# Patient Record
Sex: Male | Born: 1955
Health system: Southern US, Community
[De-identification: ages and names within clinical notes are randomized; demographics above are authoritative.]

## PROBLEM LIST (undated history)

## (undated) DIAGNOSIS — I10 Essential (primary) hypertension: Secondary | ICD-10-CM

## (undated) DIAGNOSIS — I2510 Atherosclerotic heart disease of native coronary artery without angina pectoris: Secondary | ICD-10-CM

## (undated) DIAGNOSIS — C4490 Unspecified malignant neoplasm of skin, unspecified: Secondary | ICD-10-CM

## (undated) HISTORY — PX: SKIN CANCER EXCISION: SHX779

## (undated) HISTORY — DX: Atherosclerotic heart disease of native coronary artery without angina pectoris: I25.10

---

## 2016-03-13 DIAGNOSIS — Z131 Encounter for screening for diabetes mellitus: Secondary | ICD-10-CM | POA: Diagnosis not present

## 2016-03-13 DIAGNOSIS — E782 Mixed hyperlipidemia: Secondary | ICD-10-CM | POA: Diagnosis not present

## 2016-03-13 DIAGNOSIS — I10 Essential (primary) hypertension: Secondary | ICD-10-CM | POA: Diagnosis not present

## 2016-04-01 DIAGNOSIS — Z125 Encounter for screening for malignant neoplasm of prostate: Secondary | ICD-10-CM | POA: Diagnosis not present

## 2016-04-01 DIAGNOSIS — Z0000 Encounter for general adult medical examination without abnormal findings: Secondary | ICD-10-CM | POA: Diagnosis not present

## 2016-04-01 DIAGNOSIS — L570 Actinic keratosis: Secondary | ICD-10-CM | POA: Diagnosis not present

## 2016-04-01 DIAGNOSIS — Z23 Encounter for immunization: Secondary | ICD-10-CM | POA: Diagnosis not present

## 2016-04-12 DIAGNOSIS — Z1212 Encounter for screening for malignant neoplasm of rectum: Secondary | ICD-10-CM | POA: Diagnosis not present

## 2016-04-12 DIAGNOSIS — Z1211 Encounter for screening for malignant neoplasm of colon: Secondary | ICD-10-CM | POA: Diagnosis not present

## 2016-05-08 DIAGNOSIS — L570 Actinic keratosis: Secondary | ICD-10-CM | POA: Diagnosis not present

## 2016-05-08 DIAGNOSIS — D485 Neoplasm of uncertain behavior of skin: Secondary | ICD-10-CM | POA: Diagnosis not present

## 2016-05-21 DIAGNOSIS — Z1211 Encounter for screening for malignant neoplasm of colon: Secondary | ICD-10-CM | POA: Diagnosis not present

## 2016-06-04 DIAGNOSIS — H1045 Other chronic allergic conjunctivitis: Secondary | ICD-10-CM | POA: Diagnosis not present

## 2016-06-05 DIAGNOSIS — D125 Benign neoplasm of sigmoid colon: Secondary | ICD-10-CM | POA: Diagnosis not present

## 2016-06-05 DIAGNOSIS — K5730 Diverticulosis of large intestine without perforation or abscess without bleeding: Secondary | ICD-10-CM | POA: Diagnosis not present

## 2016-06-05 DIAGNOSIS — Z1211 Encounter for screening for malignant neoplasm of colon: Secondary | ICD-10-CM | POA: Diagnosis not present

## 2016-06-05 DIAGNOSIS — K635 Polyp of colon: Secondary | ICD-10-CM | POA: Diagnosis not present

## 2016-06-05 DIAGNOSIS — R195 Other fecal abnormalities: Secondary | ICD-10-CM | POA: Diagnosis not present

## 2016-06-05 DIAGNOSIS — D124 Benign neoplasm of descending colon: Secondary | ICD-10-CM | POA: Diagnosis not present

## 2016-06-05 DIAGNOSIS — D128 Benign neoplasm of rectum: Secondary | ICD-10-CM | POA: Diagnosis not present

## 2016-06-12 DIAGNOSIS — I214 Non-ST elevation (NSTEMI) myocardial infarction: Secondary | ICD-10-CM | POA: Diagnosis not present

## 2016-06-12 DIAGNOSIS — I4519 Other right bundle-branch block: Secondary | ICD-10-CM | POA: Diagnosis not present

## 2016-06-12 DIAGNOSIS — D62 Acute posthemorrhagic anemia: Secondary | ICD-10-CM | POA: Diagnosis not present

## 2016-06-12 DIAGNOSIS — R079 Chest pain, unspecified: Secondary | ICD-10-CM | POA: Diagnosis not present

## 2016-06-12 DIAGNOSIS — K91840 Postprocedural hemorrhage and hematoma of a digestive system organ or structure following a digestive system procedure: Secondary | ICD-10-CM | POA: Diagnosis not present

## 2016-06-12 DIAGNOSIS — I959 Hypotension, unspecified: Secondary | ICD-10-CM | POA: Diagnosis not present

## 2016-06-12 HISTORY — PX: COLONOSCOPY: SHX5424

## 2016-06-13 ENCOUNTER — Inpatient Hospital Stay (HOSPITAL_COMMUNITY): Payer: BLUE CROSS/BLUE SHIELD

## 2016-06-13 ENCOUNTER — Inpatient Hospital Stay (HOSPITAL_COMMUNITY)
Admission: AD | Disposition: A | Discharge status: Home / Self Care | Payer: Self-pay | Source: Other Acute Inpatient Hospital | Attending: Internal Medicine

## 2016-06-13 ENCOUNTER — Encounter (HOSPITAL_COMMUNITY): Payer: Self-pay

## 2016-06-13 ENCOUNTER — Other Ambulatory Visit: Payer: Self-pay | Admitting: Physician Assistant

## 2016-06-13 ENCOUNTER — Inpatient Hospital Stay (HOSPITAL_COMMUNITY)
Admission: AD | Admit: 2016-06-13 | Discharge: 2016-06-16 | DRG: 280 | Disposition: A | Discharge status: Home / Self Care | Payer: BLUE CROSS/BLUE SHIELD | Source: Other Acute Inpatient Hospital | Attending: Internal Medicine | Admitting: Internal Medicine

## 2016-06-13 DIAGNOSIS — Z85828 Personal history of other malignant neoplasm of skin: Secondary | ICD-10-CM

## 2016-06-13 DIAGNOSIS — Z86010 Personal history of colonic polyps: Secondary | ICD-10-CM

## 2016-06-13 DIAGNOSIS — R079 Chest pain, unspecified: Secondary | ICD-10-CM | POA: Diagnosis not present

## 2016-06-13 DIAGNOSIS — D62 Acute posthemorrhagic anemia: Secondary | ICD-10-CM

## 2016-06-13 DIAGNOSIS — I214 Non-ST elevation (NSTEMI) myocardial infarction: Secondary | ICD-10-CM | POA: Diagnosis present

## 2016-06-13 DIAGNOSIS — Y95 Nosocomial condition: Secondary | ICD-10-CM | POA: Diagnosis not present

## 2016-06-13 DIAGNOSIS — Z87891 Personal history of nicotine dependence: Secondary | ICD-10-CM | POA: Diagnosis not present

## 2016-06-13 DIAGNOSIS — K922 Gastrointestinal hemorrhage, unspecified: Secondary | ICD-10-CM | POA: Diagnosis not present

## 2016-06-13 DIAGNOSIS — J189 Pneumonia, unspecified organism: Secondary | ICD-10-CM | POA: Diagnosis not present

## 2016-06-13 DIAGNOSIS — I2510 Atherosclerotic heart disease of native coronary artery without angina pectoris: Secondary | ICD-10-CM | POA: Diagnosis not present

## 2016-06-13 DIAGNOSIS — I9581 Postprocedural hypotension: Secondary | ICD-10-CM | POA: Diagnosis not present

## 2016-06-13 DIAGNOSIS — I248 Other forms of acute ischemic heart disease: Secondary | ICD-10-CM | POA: Diagnosis not present

## 2016-06-13 DIAGNOSIS — I10 Essential (primary) hypertension: Secondary | ICD-10-CM | POA: Diagnosis not present

## 2016-06-13 DIAGNOSIS — E876 Hypokalemia: Secondary | ICD-10-CM | POA: Diagnosis not present

## 2016-06-13 DIAGNOSIS — R509 Fever, unspecified: Secondary | ICD-10-CM

## 2016-06-13 DIAGNOSIS — K625 Hemorrhage of anus and rectum: Secondary | ICD-10-CM | POA: Diagnosis not present

## 2016-06-13 DIAGNOSIS — K91840 Postprocedural hemorrhage and hematoma of a digestive system organ or structure following a digestive system procedure: Secondary | ICD-10-CM | POA: Diagnosis not present

## 2016-06-13 DIAGNOSIS — I209 Angina pectoris, unspecified: Secondary | ICD-10-CM

## 2016-06-13 DIAGNOSIS — R072 Precordial pain: Secondary | ICD-10-CM | POA: Diagnosis not present

## 2016-06-13 DIAGNOSIS — J9811 Atelectasis: Secondary | ICD-10-CM | POA: Diagnosis not present

## 2016-06-13 HISTORY — PX: CARDIAC CATHETERIZATION: SHX172

## 2016-06-13 HISTORY — DX: Unspecified malignant neoplasm of skin, unspecified: C44.90

## 2016-06-13 HISTORY — DX: Essential (primary) hypertension: I10

## 2016-06-13 LAB — CBC
HCT: 31.2 % — ABNORMAL LOW (ref 39.0–52.0)
HCT: 31.8 % — ABNORMAL LOW (ref 39.0–52.0)
HCT: 29.8 % — ABNORMAL LOW (ref 39.0–52.0)
HEMOGLOBIN: 10.6 g/dL — AB (ref 13.0–17.0)
Hemoglobin: 10.8 g/dL — ABNORMAL LOW (ref 13.0–17.0)
Hemoglobin: 11 g/dL — ABNORMAL LOW (ref 13.0–17.0)
MCH: 31.3 pg (ref 26.0–34.0)
MCH: 31.5 pg (ref 26.0–34.0)
MCH: 32.1 pg (ref 26.0–34.0)
MCHC: 34.6 g/dL (ref 30.0–36.0)
MCHC: 34.6 g/dL (ref 30.0–36.0)
MCHC: 35.6 g/dL (ref 30.0–36.0)
MCV: 90.4 fL (ref 78.0–100.0)
MCV: 91.1 fL (ref 78.0–100.0)
MCV: 90.3 fL (ref 78.0–100.0)
PLATELETS: 212 10*3/uL (ref 150–400)
PLATELETS: 197 10*3/uL (ref 150–400)
PLATELETS: 197 10*3/uL (ref 150–400)
RBC: 3.45 MIL/uL — ABNORMAL LOW (ref 4.22–5.81)
RBC: 3.49 MIL/uL — ABNORMAL LOW (ref 4.22–5.81)
RBC: 3.3 MIL/uL — AB (ref 4.22–5.81)
RDW: 13.2 % (ref 11.5–15.5)
RDW: 13.2 % (ref 11.5–15.5)
RDW: 13.3 % (ref 11.5–15.5)
WBC: 10 10*3/uL (ref 4.0–10.5)
WBC: 10.1 10*3/uL (ref 4.0–10.5)
WBC: 11.2 10*3/uL — ABNORMAL HIGH (ref 4.0–10.5)

## 2016-06-13 LAB — URINALYSIS, ROUTINE W REFLEX MICROSCOPIC
BILIRUBIN URINE: NEGATIVE
GLUCOSE, UA: NEGATIVE mg/dL
HGB URINE DIPSTICK: NEGATIVE
KETONES UR: NEGATIVE mg/dL
Leukocytes, UA: NEGATIVE
NITRITE: NEGATIVE
PH: 7.5 (ref 5.0–8.0)
Protein, ur: NEGATIVE mg/dL
Specific Gravity, Urine: 1.008 (ref 1.005–1.030)

## 2016-06-13 LAB — TROPONIN I
TROPONIN I: 15.66 ng/mL — AB (ref <0.03)
TROPONIN I: 19.35 ng/mL — AB (ref <0.03)
TROPONIN I: 22.95 ng/mL — AB (ref <0.03)
TROPONIN I: 41.06 ng/mL — AB (ref <0.03)

## 2016-06-13 LAB — COMPREHENSIVE METABOLIC PANEL
ALT: 31 U/L (ref 17–63)
AST: 131 U/L — ABNORMAL HIGH (ref 15–41)
Albumin: 3.5 g/dL (ref 3.5–5.0)
Alkaline Phosphatase: 64 U/L (ref 38–126)
Anion gap: 6 (ref 5–15)
BUN: 6 mg/dL (ref 6–20)
CHLORIDE: 108 mmol/L (ref 101–111)
CO2: 22 mmol/L (ref 22–32)
CREATININE: 0.69 mg/dL (ref 0.61–1.24)
Calcium: 8.5 mg/dL — ABNORMAL LOW (ref 8.9–10.3)
GFR calc non Af Amer: >60 mL/min (ref >60)
Glucose, Bld: 109 mg/dL — ABNORMAL HIGH (ref 65–99)
POTASSIUM: 3.5 mmol/L (ref 3.5–5.1)
SODIUM: 136 mmol/L (ref 135–145)
Total Bilirubin: 1.4 mg/dL — ABNORMAL HIGH (ref 0.3–1.2)
Total Protein: 5.3 g/dL — ABNORMAL LOW (ref 6.5–8.1)

## 2016-06-13 LAB — TYPE AND SCREEN
ABO/RH(D): O POS
ANTIBODY SCREEN: NEGATIVE

## 2016-06-13 LAB — ECHOCARDIOGRAM COMPLETE
HEIGHTINCHES: 68 in
WEIGHTICAEL: 3140.8 [oz_av]

## 2016-06-13 LAB — PROTIME-INR
INR: 1.08
Prothrombin Time: 14 seconds (ref 11.4–15.2)

## 2016-06-13 LAB — ABO/RH: ABO/RH(D): O POS

## 2016-06-13 LAB — MRSA PCR SCREENING: MRSA BY PCR: NEGATIVE

## 2016-06-13 SURGERY — LEFT HEART CATH AND CORONARY ANGIOGRAPHY
Anesthesia: LOCAL

## 2016-06-13 MED ORDER — SODIUM CHLORIDE 0.9 % IV SOLN: INTRAVENOUS | Status: AC

## 2016-06-13 MED ORDER — ASPIRIN 81 MG PO CHEW: 81.0000 mg | CHEWABLE_TABLET | ORAL | Status: DC

## 2016-06-13 MED ORDER — HEPARIN (PORCINE) IN NACL 2-0.9 UNIT/ML-% IJ SOLN
INTRAMUSCULAR | Status: DC | PRN
  Administered 2016-06-13: 15:00:00

## 2016-06-13 MED ORDER — HEPARIN (PORCINE) IN NACL 100-0.45 UNIT/ML-% IJ SOLN: 1000.0000 [IU]/h | INTRAMUSCULAR | Status: DC

## 2016-06-13 MED ORDER — BISACODYL 5 MG PO TBEC: 5.0000 mg | DELAYED_RELEASE_TABLET | Freq: Every day | ORAL | Status: DC | PRN

## 2016-06-13 MED ORDER — NITROGLYCERIN IN D5W 200-5 MCG/ML-% IV SOLN
2.0000 ug/min | INTRAVENOUS | Status: DC
  Filled 2016-06-13: qty 250

## 2016-06-13 MED ORDER — SODIUM CHLORIDE 0.9 % IV SOLN
250.0000 mL | INTRAVENOUS | Status: DC | PRN
Start: 2016-06-13 — End: 2016-06-16

## 2016-06-13 MED ORDER — SODIUM CHLORIDE 0.9% FLUSH
3.0000 mL | Freq: Two times a day (BID) | INTRAVENOUS | Status: DC
  Administered 2016-06-13 – 2016-06-16 (×5): 3 mL via INTRAVENOUS

## 2016-06-13 MED ORDER — HEPARIN SODIUM (PORCINE) 1000 UNIT/ML IJ SOLN
INTRAMUSCULAR | Status: DC | PRN
  Administered 2016-06-13: 3000 [IU] via INTRAVENOUS

## 2016-06-13 MED ORDER — SODIUM CHLORIDE 0.9% FLUSH
3.0000 mL | INTRAVENOUS | Status: DC | PRN
Start: 2016-06-13 — End: 2016-06-16

## 2016-06-13 MED ORDER — SODIUM CHLORIDE 0.9% FLUSH: 3.0000 mL | INTRAVENOUS | Status: DC | PRN

## 2016-06-13 MED ORDER — ACETAMINOPHEN 325 MG PO TABS
650.0000 mg | ORAL_TABLET | Freq: Four times a day (QID) | ORAL | Status: DC | PRN
  Administered 2016-06-13 – 2016-06-15 (×4): 650 mg via ORAL
  Filled 2016-06-13 (×4): qty 2

## 2016-06-13 MED ORDER — ACETAMINOPHEN 650 MG RE SUPP
650.0000 mg | Freq: Four times a day (QID) | RECTAL | Status: DC | PRN
Start: 2016-06-13 — End: 2016-06-16

## 2016-06-13 MED ORDER — ONDANSETRON HCL 4 MG/2ML IJ SOLN: 4.0000 mg | Freq: Four times a day (QID) | INTRAMUSCULAR | Status: DC | PRN

## 2016-06-13 MED ORDER — FENTANYL CITRATE (PF) 100 MCG/2ML IJ SOLN
INTRAMUSCULAR | Status: AC
  Filled 2016-06-13: qty 2

## 2016-06-13 MED ORDER — SODIUM CHLORIDE 0.9 % IV SOLN
INTRAVENOUS | Status: DC
  Administered 2016-06-13: 09:00:00 via INTRAVENOUS

## 2016-06-13 MED ORDER — HEPARIN (PORCINE) IN NACL 2-0.9 UNIT/ML-% IJ SOLN
INTRAMUSCULAR | Status: AC
  Filled 2016-06-13: qty 1000

## 2016-06-13 MED ORDER — LIDOCAINE HCL (PF) 1 % IJ SOLN
INTRAMUSCULAR | Status: AC
  Filled 2016-06-13: qty 30

## 2016-06-13 MED ORDER — MIDAZOLAM HCL 2 MG/2ML IJ SOLN
INTRAMUSCULAR | Status: AC
  Filled 2016-06-13: qty 2

## 2016-06-13 MED ORDER — ASPIRIN 81 MG PO CHEW
324.0000 mg | CHEWABLE_TABLET | Freq: Once | ORAL | Status: AC
  Administered 2016-06-13: 324 mg via ORAL
  Filled 2016-06-13: qty 4

## 2016-06-13 MED ORDER — FENTANYL CITRATE (PF) 100 MCG/2ML IJ SOLN
INTRAMUSCULAR | Status: DC | PRN
  Administered 2016-06-13: 50 ug via INTRAVENOUS
  Administered 2016-06-13: 25 ug via INTRAVENOUS

## 2016-06-13 MED ORDER — ATORVASTATIN CALCIUM 80 MG PO TABS
80.0000 mg | ORAL_TABLET | Freq: Every day | ORAL | Status: DC
  Administered 2016-06-13 – 2016-06-16 (×4): 80 mg via ORAL
  Filled 2016-06-13 (×4): qty 1

## 2016-06-13 MED ORDER — HEPARIN (PORCINE) IN NACL 100-0.45 UNIT/ML-% IJ SOLN
1000.0000 [IU]/h | INTRAMUSCULAR | Status: DC
  Administered 2016-06-13: 1000 [IU]/h via INTRAVENOUS
  Filled 2016-06-13: qty 250

## 2016-06-13 MED ORDER — SODIUM CHLORIDE 0.9 % IV SOLN: INTRAVENOUS | Status: DC

## 2016-06-13 MED ORDER — METOPROLOL TARTRATE 12.5 MG HALF TABLET
12.5000 mg | ORAL_TABLET | Freq: Four times a day (QID) | ORAL | Status: DC
  Administered 2016-06-13 – 2016-06-16 (×12): 12.5 mg via ORAL
  Filled 2016-06-13 (×11): qty 1

## 2016-06-13 MED ORDER — VERAPAMIL HCL 2.5 MG/ML IV SOLN
INTRAVENOUS | Status: AC
  Filled 2016-06-13: qty 2

## 2016-06-13 MED ORDER — IOPAMIDOL (ISOVUE-370) INJECTION 76%
INTRAVENOUS | Status: AC
  Filled 2016-06-13: qty 100

## 2016-06-13 MED ORDER — POLYETHYLENE GLYCOL 3350 17 G PO PACK
17.0000 g | PACK | Freq: Every day | ORAL | Status: DC | PRN
Start: 2016-06-13 — End: 2016-06-15

## 2016-06-13 MED ORDER — SODIUM CHLORIDE 0.9% FLUSH
3.0000 mL | Freq: Two times a day (BID) | INTRAVENOUS | Status: DC
  Administered 2016-06-13 – 2016-06-14 (×4): 3 mL via INTRAVENOUS

## 2016-06-13 MED ORDER — MIDAZOLAM HCL 2 MG/2ML IJ SOLN
INTRAMUSCULAR | Status: DC | PRN
  Administered 2016-06-13 (×2): 1 mg via INTRAVENOUS

## 2016-06-13 MED ORDER — LIDOCAINE HCL (PF) 1 % IJ SOLN
INTRAMUSCULAR | Status: DC | PRN
  Administered 2016-06-13: 2 mL via SUBCUTANEOUS

## 2016-06-13 MED ORDER — HEPARIN SODIUM (PORCINE) 1000 UNIT/ML IJ SOLN
INTRAMUSCULAR | Status: AC
  Filled 2016-06-13: qty 1

## 2016-06-13 MED ORDER — SODIUM CHLORIDE 0.9 % IV SOLN: 250.0000 mL | INTRAVENOUS | Status: DC | PRN

## 2016-06-13 MED ORDER — IOPAMIDOL (ISOVUE-370) INJECTION 76%
INTRAVENOUS | Status: DC | PRN
  Administered 2016-06-13: 40 mL

## 2016-06-13 MED ORDER — ONDANSETRON HCL 4 MG PO TABS
4.0000 mg | ORAL_TABLET | Freq: Four times a day (QID) | ORAL | Status: DC | PRN
Start: 2016-06-13 — End: 2016-06-16

## 2016-06-13 MED ORDER — SODIUM CHLORIDE 0.9% FLUSH
3.0000 mL | Freq: Two times a day (BID) | INTRAVENOUS | Status: DC
Start: 2016-06-13 — End: 2016-06-13
  Administered 2016-06-13: 3 mL via INTRAVENOUS

## 2016-06-13 SURGICAL SUPPLY — 8 items
CATH IMPULSE 5F ANG/FL3.5 (CATHETERS) ×2 IMPLANT
DEVICE RAD COMP TR BAND LRG (VASCULAR PRODUCTS) ×2 IMPLANT
GLIDESHEATH SLEND SS 6F .021 (SHEATH) ×2 IMPLANT
KIT HEART LEFT (KITS) ×2 IMPLANT
PACK CARDIAC CATHETERIZATION (CUSTOM PROCEDURE TRAY) ×2 IMPLANT
TRANSDUCER W/STOPCOCK (MISCELLANEOUS) ×2 IMPLANT
TUBING CIL FLEX 10 FLL-RA (TUBING) ×2 IMPLANT
WIRE SAFE-T 1.5MM-J .035X260CM (WIRE) ×2 IMPLANT

## 2016-06-13 NOTE — Progress Notes (Signed)
Patient's angiograms were discussed with Dr. Gwenlyn Found.  Given OM occlusion likely occurred >24 hours ago and the patient's recent GI bleed.  We will not restart heparin post-cath.  Nelva Bush, MD [facility] Pager: [phone]

## 2016-06-13 NOTE — Progress Notes (Addendum)
ANTICOAGULATION CONSULT NOTE - Initial Consult  Pharmacy Consult for heparin Indication: NSTEMI  No Known Allergies  Patient Measurements: Height: 5\' 8"  (172.7 cm) Weight: 196 lb 4.8 oz (89 kg) IBW/kg (Calculated) : 68.4 Heparin Dosing Weight: 86.6kg  Vital Signs: Temp: 98.6 F (37 C) (08/31 0827) Temp Source: Oral (08/31 0827) BP: 142/89 (08/31 0827) Pulse Rate: 85 (08/31 0827)  Labs:  Recent Labs  06/13/16 0607 06/13/16 0854  HGB 10.8* 11.0*  HCT 31.2* 31.8*  PLT 212 197  CREATININE 0.69  --   TROPONINI 15.66* 19.35*    Estimated Creatinine Clearance: 105.1 mL/min (by C-G formula based on SCr of 0.8 mg/dL).   Medical History: Past Medical History:  Diagnosis Date  . HTN (hypertension)   . Skin cancer    Basal and Squamous    Medications:  See EMR  Assessment: 66 YOM to start heparin for NSTEMI. No PTA anti-coag. Noted patient developed a GIB on 8/29 secondary to colonoscopy and polypectomy on 8/23. GIB subsequently treated at [facility]. Will dose heparin conservatively without bolus and plan to stay at lower end of therapeutic range. CBC currently stable - Hgb 11.0, plt 197. No bleeding reported.   Goal of Therapy:  Heparin level 0.3-0.5 units/ml Monitor platelets by anticoagulation protocol: Yes   Plan:  -Start heparin at 1000 units/hr (no bolus) -6hr HL -Daily HL/CBC -Monitor closely for bleeding -F/U post-cath   Stephens November, PharmD Clinical Pharmacist 10:54 AM, 06/13/2016  ADDENDUM: To cath today prior to initial HL. To restart 8hr post-sheath removal.   Plan: -Restart heparin at 1000 units/hr (no bolus) at 2300 -6hr HL -Daily HL/CBC -Monitor closely for bleeding

## 2016-06-13 NOTE — Progress Notes (Signed)
Pt was found to be febrile with a temp of 100.5. Tylenol was administered and within 30 minutes temp had spiked to 102.2. Temp is now 99.3 1 hour post tylenol. On call hospitalist paged with status change. See new orders. Will continue to monitor.

## 2016-06-13 NOTE — Progress Notes (Signed)
Troponin 19.35, asa restarted, cards aware. Echo result pending. pharmacy consulted for heparin drip.

## 2016-06-13 NOTE — Progress Notes (Addendum)
   See my complete consult note from earlier this morning. I have reviewed the case with Dr. Gwenlyn Found. He and I both agree that it would be wise to proceed with diagnostic catheterization today to assess the patient's anatomy. At that time further decisions can be made about the approach to his cardiac status keeping in mind post polypectomy GI bleeding that was treated yesterday. Rhonda Barrett PA-C is currently trying to contact the GI team at [facility] to get the exact specifics of the procedure done yesterday to stop the bleeding from the post polypectomy sites. This information will help with decisions about how to proceed with his cardiac status today.  Daryel November, MD  I have explained the entire situation of the patient and recommended cardiac catheterization today. I've discussed wrist. He understands and wants to proceed.  Daryel November, MD  The current labs do not include prothrombin time. I suspect this was checked at [facility] yesterday. We will aggressively look for this result.  Daryel November, MD

## 2016-06-13 NOTE — Progress Notes (Signed)
Troponin + 15.66, Cardiology made aware

## 2016-06-13 NOTE — Progress Notes (Signed)
  Echocardiogram 2D Echocardiogram has been performed.  Darlina Sicilian M 06/13/2016, 9:37 AM

## 2016-06-13 NOTE — Consult Note (Addendum)
CARDIOLOGY CONSULT NOTE   Patient ID: Clayton Jones MRN: [id number] DOB/AGE: October 15, 1955 60 y.o.  Admit date: 06/13/2016  Primary Physician   Teressa Lower, MD Primary Cardiologist   New, Dr Fabio Neighbors [facility] Reason for Consultation   Chest pain, elevated troponin Requesting MD: Dr Tamala Julian  [id number] Clayton Jones is a 60 y.o. year old male with a history of HTN, but no hx DM, HLD or CAD.  He had a colonoscopy and polypectomy 08/23. He developed LGIB on 08/29 and ended up back at [facility]. Initial HGB 13 and initial troponin negative.  Another colonoscopy indicated, he took the prep, and was having frequent liquid bloody bowel movements. He developed orthostatic dizziness and hypotension. He felt very dizzy and light-headed and was diaphoretic, his H&H were decreasing. He had the colonoscopy, and he was bleeding at the polyp site.  After the procedure, at about 5 pm, he developed chest pain that was burning in nature and also tightness. Worse on the R than on the L, and will occasionally radiate to the R arm with tingling. 5/10 at its worst., decreased to a 1-2 in 10 minutes. It had gone away, but now he feels a 1/10 tightness on the right that is worse with deep inspiration.   He had a mild episode last week that was only a few seconds in duration and started at rest.     Past Medical History:  Diagnosis Date  . HTN (hypertension)   . Skin cancer    Basal and Squamous     Past Surgical History:  Procedure Laterality Date  . SKIN CANCER EXCISION     6 different procedures, all minor   Prior to Admission medications   None     Social History   Social History  . Marital status: Single    Spouse name: N/A  . Number of children: N/A  . Years of education: N/A   Occupational History  . 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 Smoking status: Former Smoker    Packs/day: 0.50    Types: Cigarettes    Quit date: 05/14/2006   . Smokeless tobacco: Current User    Types: Snuff  . Alcohol use 2.4 oz/week    4 Cans of beer per week     Comment: 3-4 beers after golf on the weekend  . Drug use: Unknown  . Sexual activity: Not Currently   Other Topics Concern  . Not on file   Social History Narrative   Pt lives alone in [place].    Family Status  Relation Status  . Mother Alive  . Father Deceased  . Brother Alive  . Sister Alive   Family History  Problem Relation Age of Onset  . Hypertension Mother     born 31  . Diabetes Father     died at 68  . Emphysema Father      ROS:  Full 14 point review of systems complete and found to be negative unless listed above.  Physical Exam: Blood pressure (!) 142/89, pulse 82, temperature 97.7 F (36.5 C), temperature source Oral, resp. rate 18, height 5\' 8"  (1.727 m), weight 196 lb 4.8 oz (89 kg), SpO2 98 %.  General: Well developed, well nourished, male in no acute distress Head: Eyes PERRLA, No xanthomas.   Normocephalic and atraumatic, oropharynx without edema or exudate. Dentition:  Lungs:  Heart: HRRR S1 S2, no rub/gallop, Heart irregular rate and  rhythm with S1, S2  murmur. pulses are 2+ all 4 extrem.   Neck: No carotid bruits. No lymphadenopathy.  JVD not elevated Abdomen: Bowel sounds present, abdomen soft and non-tender without masses or hernias noted. Msk:  No spine or cva tenderness. No weakness, no joint deformities or effusions. Extremities: No clubbing or cyanosis. No edema.  Neuro: Alert and oriented X 3. No focal deficits noted. Psych:  Good affect, responds appropriately Skin: No rashes or lesions noted.  Labs:   Lab Results  Component Value Date   WBC 10.0 06/13/2016   HGB 10.8 (L) 06/13/2016   HCT 31.2 (L) 06/13/2016   MCV 90.4 06/13/2016   PLT 212 06/13/2016     Recent Labs Lab 06/13/16 0607  NA 136  K 3.5  CL 108  CO2 22  BUN 6  CREATININE 0.69  CALCIUM 8.5*  PROT 5.3*  BILITOT 1.4*  ALKPHOS 64  ALT 31  AST 131*   GLUCOSE 109*  ALBUMIN 3.5    Recent Labs  06/13/16 0607  TROPONINI 15.66*   ECG:  08/31 SR, no acute changes  Radiology:  Dg Chest Port 1 View Result Date: 06/13/2016 CLINICAL DATA:  Chest pain EXAM: PORTABLE CHEST 1 VIEW COMPARISON:  None. FINDINGS: Mild linear scarring or atelectasis in the left base. The right lung is clear. The pulmonary vasculature is normal. Hilar and mediastinal contours are unremarkable. Heart size is normal. No large effusions. IMPRESSION: Linear left base scarring or atelectasis. Electronically Signed   By: Andreas Newport M.D.   On: 06/13/2016 06:22    ASSESSMENT AND PLAN:   The patient was seen today by Dr Ron Parker, the patient evaluated and the data reviewed.      GI bleed - per IM +/- GI - had bleeding at polyp site that was treated at [facility].  At this point, we feel that the patient has had definitive treatment for the bleeding post polypectomy sites. This will be kept in mind as decisions are made about anticoagulation and possible cardiac catheterization.     NSTEMI (non-ST elevated myocardial infarction) ([place]) - troponin going higher, 0.17>>1.61 at [place], now 15.66.  His pain yesterday was felt across the entire upper chest. He has not had this symptom before. He has not had exertional symptoms and has been relatively active. Add BB and statin. We have considered possible IV nitroglycerin. I feel this is not necessary at this point this morning. It is possible that the patient's ischemic event was related to the hypotension that he had at the time of his bowel prep for his repeat colonoscopy yesterday. It seems very likely that he does have fixed coronary disease. Because he has received definitive treatment for his bleeding polyps, I feel that we should add aspirin now in the current setting. I've chosen not to add heparin this morning.  The patient will have a stat 2-D echo. I will review the images. If he has no significant wall motion  abnormality, we will continue to watch him clinically today to be sure that he does not have any significant recurrent GI bleeding. We will then decide about the timing of elective cardiac catheterization, possibly tomorrow. If he has a significant wall motion abnormality, I will talk with the cath team about proceeding with catheterization today, understanding that decisions about anticoagulation are complex.   (Evonne Rinks:  More data:  10:03AM:  I personally reviewed the echocardiogram done at the bedside in the past few minutes. The EF remains in the  range of 50%. There is a definite basal inferolateral wall motion abnormality. I will discuss the overall situation further with the interventional cath team.)   Localized right upper anterior chest pain     Current pain is quite vague. He thinks that he has had this symptom intermittently for months not related to exertion. At this point, I feel that this very localized symptom is not a continuation of the ischemic pain that he had yesterday.    HTN (hypertension) - pt taken off rx by primary MD as he was doing better - per IM, but from cards standpoint would benefit from BB   Signed: Lenoard Aden 06/13/2016 7:56 AM Beeper [phone] Patient seen and examined. I agree with the assessment and plan as detailed above. See also my additional thoughts below.   My thoughts are reflected in the assessment and plan above. I spent a prolonged period of time making careful decisions about the approach this patient who clearly has had an ischemic event. He did have bleeding colon polypectomy sites treated successfully yesterday. Hemoglobin is stable. In my most recent update mentioned above, the echo shows a definite inferolateral wall motion abnormality at the base. The patient needs definite cardiac catheterization. Further discussion will be ongoing concerning the best timing for this because of his recent bleeding colon polypectomy sites that were treated  yesterday. I gave him aspirin earlier today. I've decided to go ahead and add heparin now. This will be given with no bolus.  Dola Argyle, MD, [facility] 06/13/2016 9:59 AM

## 2016-06-13 NOTE — H&P (View-Only) (Signed)
   See my complete consult note from earlier this morning. I have reviewed the case with Dr. Gwenlyn Found. He and I both agree that it would be wise to proceed with diagnostic catheterization today to assess the patient's anatomy. At that time further decisions can be made about the approach to his cardiac status keeping in mind post polypectomy GI bleeding that was treated yesterday. Rhonda Barrett PA-C is currently trying to contact the GI team at [facility] to get the exact specifics of the procedure done yesterday to stop the bleeding from the post polypectomy sites. This information will help with decisions about how to proceed with his cardiac status today.  Daryel November, MD  I have explained the entire situation of the patient and recommended cardiac catheterization today. I've discussed wrist. He understands and wants to proceed.  Daryel November, MD  The current labs do not include prothrombin time. I suspect this was checked at [facility] yesterday. We will aggressively look for this result.  Daryel November, MD

## 2016-06-13 NOTE — H&P (Signed)
History and Physical    Clayton Jones [id number] DOB: 03-18-1956 DOA: 06/13/2016  PCP: Teressa Lower, MD  Patient coming from:   Home   Chief Complaint:  GI bleed, chest pain  HPI: Clayton Jones is a 60 y.o. male , relatively healthy, who underwent screening colonoscopy last Wed. Multiple hyperplastic polyps were removed from rectum and sigmoid. Two descending colon polyps 1-1.5cm were also removed. Tuesday evening patient began having painless rectal bleeding. He was admitted to [facility] and yesterday he was prepped for repeat colonoscopy for control of bleeding. During prep patient had an episode of diaphoresis, chest pain and hypotension. He had the. After returning to room from the procedure patient had an another episode of dizziness and chest pain described as tightness. The tightness was worse with deep inspiration and is greatest in right side of chest. Pain was 4/5 on scale. It improved with 2 SL NTG. He reports some tingling in RUE which also occurred last week at work. Because of the chest pain a trop was obtained and found to be elevated at 1.17 then 1.61 on recheck.  Hgb fell from 14 to 9.  He received two units of blood  Review of Systems: As per HPI, otherwise 10 point review of systems negative.   Past Medical History:  Diagnosis Date  . HTN (hypertension)   . Skin cancer    Basal and Squamous    Past Surgical History:  Procedure Laterality Date  . SKIN CANCER EXCISION     6 different procedures, all minor    Social History   Social History  . Marital status: Single    Spouse name: N/A  . Number of children: N/A  . Years of education: N/A   Occupational History  . Not on file.   Social History Main Topics  . Smoking status: Not on file  . Smokeless tobacco: Not on file  . Alcohol use Not on file  . Drug use: Unknown  . Sexual activity: Not on file   Other Topics Concern  . Not on file   Social History Narrative  . No narrative on file    Lives alone at home. No assistive devices needed for ambulation. Stopped smoking 10 years ago. Drinks 4-5 beers on the weekend.   Family History  Problem Relation Age of Onset  . Hypertension Mother     born 51  . Diabetes Father     died at 60  . Emphysema Father     Home meds:   None  Physical Exam: Vitals:   06/13/16 0519 06/13/16 0526  BP:  (!) 142/89  Pulse:  82  Resp:  18  Temp:  97.7 F (36.5 C)  TempSrc:  Oral  SpO2:  98%  Weight: 89 kg (196 lb 4.8 oz)   Height: 5\' 8"  (1.727 m)     Constitutional:   Pleasant white well developed male in NAD, calm, comfortable Vitals:   06/13/16 0519 06/13/16 0526  BP:  (!) 142/89  Pulse:  82  Resp:  18  Temp:  97.7 F (36.5 C)  TempSrc:  Oral  SpO2:  98%  Weight: 89 kg (196 lb 4.8 oz)   Height: 5\' 8"  (1.727 m)    Eyes: PER, lids and conjunctivae normal ENMT: Mucous membranes are moist. Posterior pharynx clear of any exudate or lesions..  Neck: normal, supple, no masses Respiratory: clear to auscultation bilaterally, no wheezing, no crackles. Normal respiratory effort. No accessory muscle use.  Cardiovascular: Regular rate and  rhythm, no murmurs / rubs / gallops. No extremity edema. 2+ dorsal pedis pulses.   Abdomen: no tenderness, no masses palpated. No hepatomegaly. Bowel sounds positive.  Musculoskeletal: no clubbing / cyanosis. No joint deformity upper and lower extremities. Good ROM, no contractures. Normal muscle tone.  Skin: no rashes, lesions, ulcers.  Neurologic: CN 2-12 grossly intact. Sensation intact, Strength 5/5 in all 4.  Psychiatric: Normal judgment and insight. Alert and oriented x 3. Normal mood.   Labs on Admission: I have personally reviewed following labs and imaging studies  Radiological Exams on Admission: Dg Chest Port 1 View  Result Date: 06/13/2016 CLINICAL DATA:  Chest pain EXAM: PORTABLE CHEST 1 VIEW COMPARISON:  None. FINDINGS: Mild linear scarring or atelectasis in the left base. The  right lung is clear. The pulmonary vasculature is normal. Hilar and mediastinal contours are unremarkable. Heart size is normal. No large effusions. IMPRESSION: Linear left base scarring or atelectasis. Electronically Signed   By: Andreas Newport M.D.   On: 06/13/2016 06:22    Assessment/Plan   Principal Problem:   GI bleed Active Problems:   NSTEMI (non-ST elevated myocardial infarction) (HCC)   HTN (hypertension)   Acute blood loss anemia         NSTEMI in setting of GI bleed. Trop 0.17 >>> 1.61 >>> now 15.66. -admit to [facility] -Cardiology evaluating patient now and adding beta blocker and statin. No need for IV nitroglycerin as ischemic event possibly secondary to the hypotensive episode experienced during bowel prep. Cardiology to decide about eventual anticoagulation and cardiac catheterization. Patient now hemodynamically stable. -Probable eventual cath per Cardiology.   Postpolypectomy bleed, s/p repeat colonoscopy with endoclip placement to site in descending colon. No bleeding since yesteray  Anemia of acute blood loss. Hgb fell to 9 from ~ 14. He received 2 units of blood at [place]. Hgb this am is 10. Bleeding has resolved, last episode yesterday afternoon.    Hx HTN, home BP meds recently discontinued by PCP (no longer needed)  DVT prophylaxis:    SCDs  Code Status:     Full code Family Communication:    none.  Disposition Plan:   Discharge  2-3 days              Consults called:  Cardiology - Evaluation in progress now Admission status:   Admission -  Telemetry    Tye Savoy NP [facility] Pager [phone]  If 7PM-7AM, please contact night-coverage [email or web address] Password [email or web address]  06/13/2016, 7:10 AM

## 2016-06-13 NOTE — Interval H&P Note (Signed)
History and Physical Interval Note:  06/13/2016 2:29 PM  Clayton Jones  has presented today for cardiac catheterization, with the diagnosis of chest pain. The various methods of treatment have been discussed with the patient and family. After consideration of risks, benefits and other options for treatment, the patient has consented to  Procedure(s): Left Heart Cath and Coronary Angiography (N/A) as a surgical intervention .  The patient's history has been reviewed, patient examined, no change in status, stable for surgery.  I have reviewed the patient's chart and labs.  Questions were answered to the patient's satisfaction.    Cath Lab Visit (complete for each Cath Lab visit)  Clinical Evaluation Leading to the Procedure:   ACS: Yes.    Non-ACS:    Anginal Classification: CCS IV  Anti-ischemic medical therapy: No Therapy  Non-Invasive Test Results: No non-invasive testing performed  Prior CABG: No previous CABG        Abigail Teall

## 2016-06-13 NOTE — Progress Notes (Signed)
[facility] call received from [facility] from Dr. Fabio Neighbors regarding transfer. Patient: Clayton Jones DOB 2056/06/09 Mr. Oliveria recently had a colonoscopy with polypectomy; who presented with bloody diarrhea. Patient was evaluated by Dr. Melina Copa of gastroenterology and underwent bowel prep for repeat colonoscopy. Hemoglobin at that time was approximately 13 with negative troponin of 0.03. Patient continued to have bloody diarrhea despite bowel prep and underwent colonoscopy with clipping of previous polypectomy site. Approximately within a 12 hour later hemoglobin dropped down to 9 for which he was immediately ordered to be transfused 2 units of packed red blood cells. Patient had reportedly complained iof chest pain and therefore troponins were rechecked. Troponin was noted to be 1.17, and subsequently 1.61 on recheck. Chest pain complaints subsided and patient was currently receiving second unit of packed red blood cells. Hemoglobin had not been rechecked as of yet secondary to the patient receiving blood products. Cardiology was consulted here at [facility], but did not feel that he belonged on their service due to acute issue. Patient was noted to be hemodynamically stable. Transferring for possible need of cardiac intervention not available at [place] to stepdown. Will need to consult GI and cardiology.

## 2016-06-13 NOTE — Plan of Care (Addendum)
Problem: Consults Goal: Cardiac Cath Patient Education (See Patient Education module for education specifics.) Outcome: Progressing Cardiac catheterization with possible coronary intervention reviewed with pt. Pt has no questions at this time. No chest pain. Refusing to watch cardiac cath video. VSS. Will continue to monitor.  troponin's trending up 22.95, heparin going at 10. Consent complete for cath. No pain at this time.

## 2016-06-14 ENCOUNTER — Encounter (HOSPITAL_COMMUNITY): Payer: Self-pay | Admitting: Internal Medicine

## 2016-06-14 ENCOUNTER — Inpatient Hospital Stay (HOSPITAL_COMMUNITY): Payer: BLUE CROSS/BLUE SHIELD

## 2016-06-14 ENCOUNTER — Telehealth: Payer: Self-pay | Admitting: Cardiovascular Disease

## 2016-06-14 DIAGNOSIS — I214 Non-ST elevation (NSTEMI) myocardial infarction: Secondary | ICD-10-CM | POA: Diagnosis present

## 2016-06-14 DIAGNOSIS — J189 Pneumonia, unspecified organism: Secondary | ICD-10-CM

## 2016-06-14 DIAGNOSIS — D62 Acute posthemorrhagic anemia: Secondary | ICD-10-CM

## 2016-06-14 DIAGNOSIS — I10 Essential (primary) hypertension: Secondary | ICD-10-CM

## 2016-06-14 LAB — BASIC METABOLIC PANEL
ANION GAP: 10 (ref 5–15)
BUN: 6 mg/dL (ref 6–20)
CALCIUM: 8.3 mg/dL — AB (ref 8.9–10.3)
CO2: 23 mmol/L (ref 22–32)
CREATININE: 0.86 mg/dL (ref 0.61–1.24)
Chloride: 104 mmol/L (ref 101–111)
Glucose, Bld: 117 mg/dL — ABNORMAL HIGH (ref 65–99)
Potassium: 3.1 mmol/L — ABNORMAL LOW (ref 3.5–5.1)
Sodium: 137 mmol/L (ref 135–145)

## 2016-06-14 LAB — CBC
HEMATOCRIT: 28.5 % — AB (ref 39.0–52.0)
HEMOGLOBIN: 9.7 g/dL — AB (ref 13.0–17.0)
MCH: 31.2 pg (ref 26.0–34.0)
MCHC: 34 g/dL (ref 30.0–36.0)
MCV: 91.6 fL (ref 78.0–100.0)
Platelets: 184 10*3/uL (ref 150–400)
RBC: 3.11 MIL/uL — AB (ref 4.22–5.81)
RDW: 13.2 % (ref 11.5–15.5)
WBC: 12.7 10*3/uL — ABNORMAL HIGH (ref 4.0–10.5)

## 2016-06-14 LAB — HEMOGLOBIN A1C
HEMOGLOBIN A1C: 5 % (ref 4.8–5.6)
MEAN PLASMA GLUCOSE: 97 mg/dL

## 2016-06-14 LAB — LIPID PANEL
CHOLESTEROL: 86 mg/dL (ref 0–200)
HDL: 35 mg/dL — ABNORMAL LOW (ref >40)
LDL Cholesterol: 35 mg/dL (ref 0–99)
Total CHOL/HDL Ratio: 2.5 RATIO
Triglycerides: 82 mg/dL (ref <150)
VLDL: 16 mg/dL (ref 0–40)

## 2016-06-14 MED ORDER — VANCOMYCIN HCL IN DEXTROSE 1-5 GM/200ML-% IV SOLN
1000.0000 mg | Freq: Three times a day (TID) | INTRAVENOUS | Status: DC
  Administered 2016-06-14 – 2016-06-16 (×6): 1000 mg via INTRAVENOUS
  Filled 2016-06-14 (×9): qty 200

## 2016-06-14 MED ORDER — SODIUM CHLORIDE 0.9 % IV SOLN
1750.0000 mg | Freq: Once | INTRAVENOUS | Status: AC
  Administered 2016-06-14: 1750 mg via INTRAVENOUS
  Filled 2016-06-14: qty 1750

## 2016-06-14 MED ORDER — SODIUM CHLORIDE 0.9 % IV SOLN
INTRAVENOUS | Status: DC
  Administered 2016-06-14 – 2016-06-15 (×3): via INTRAVENOUS

## 2016-06-14 MED ORDER — PIPERACILLIN-TAZOBACTAM 3.375 G IVPB
3.3750 g | Freq: Three times a day (TID) | INTRAVENOUS | Status: DC
  Administered 2016-06-14 – 2016-06-16 (×8): 3.375 g via INTRAVENOUS
  Filled 2016-06-14 (×11): qty 50

## 2016-06-14 MED ORDER — POTASSIUM CHLORIDE CRYS ER 20 MEQ PO TBCR
40.0000 meq | EXTENDED_RELEASE_TABLET | Freq: Once | ORAL | Status: AC
  Administered 2016-06-14: 40 meq via ORAL
  Filled 2016-06-14: qty 2

## 2016-06-14 MED FILL — Verapamil HCl IV Soln 2.5 MG/ML: INTRAVENOUS | Qty: 2 | Status: AC

## 2016-06-14 NOTE — Plan of Care (Addendum)
Problem: Fluid Volume: Goal: Ability to maintain a balanced intake and output will improve Outcome: Progressing 3W 21 Clayton Jones metoprolol held, BP 98/60, temp 100.1, asymptomatic, MD aware. Give tylenol per MD.  Pt receiving Vanc and Zosyn, NS 100. Incentive spirometer given to pt, demonstrated with teach back. Will continue to monitor.

## 2016-06-14 NOTE — Telephone Encounter (Signed)
TOC Phone call.  Appt is on 07/02/16 at 10am w/ Pharmacist  And 10:30am w/Chris Sharolyn Douglas  Thanks

## 2016-06-14 NOTE — Progress Notes (Signed)
Pharmacy Antibiotic Note  Clayton Jones is a 60 y.o. male admitted on 06/13/2016 with NSTEMI. To start empiric vancomycin/zosyn for suspected PNA. Spiked fever of 102.5 yesterday evening. CXR today shows slight LLL atelectasis without consolidation. Currently afebrile, WBC 11.2>12.7.  Plan: -Vancomycin 1750mg  x1 follwed by 1000mg  q8h -Zosyn 3.375g q8h -F/U cultures -Monitor renal fcn, clinical status, LOT  Height: 5\' 8"  (172.7 cm) Weight: 192 lb 10.9 oz (87.4 kg) IBW/kg (Calculated) : 68.4  Temp (24hrs), Avg:99.5 F (37.5 C), Min:98.2 F (36.8 C), Max:102.2 F (39 C)   Recent Labs Lab 06/13/16 0607 06/13/16 0854 06/13/16 1204 06/14/16 0543  WBC 10.0 10.1 11.2* 12.7*  CREATININE 0.69  --   --  0.86    Estimated Creatinine Clearance: 97 mL/min (by C-G formula based on SCr of 0.86 mg/dL).    No Known Allergies  Antimicrobials this admission: Vancomycin 9/1 >> Zosyn 9/1 >>   Microbiology results: 8/31 BCx: sent 8/31 MRSA PCR: neg   Thank you for allowing pharmacy to be a part of this patient's care.  Stephens November, PharmD Clinical Pharmacist 8:44 AM, 06/14/2016

## 2016-06-14 NOTE — Progress Notes (Signed)
Subjective:  No CP/SOB  Objective:  Temp:  [98.2 F (36.8 C)-102.2 F (39 C)] 98.2 F (36.8 C) (09/01 0807) Pulse Rate:  [71-92] 82 (09/01 0807) Resp:  [11-25] 25 (09/01 0807) BP: (96-137)/(47-90) 103/65 (09/01 0807) SpO2:  [95 %-100 %] 97 % (09/01 0807) Weight:  [192 lb 10.9 oz (87.4 kg)] 192 lb 10.9 oz (87.4 kg) (09/01 0450) Weight change: -3 lb 9.9 oz (-1.641 kg)  Intake/Output from previous day: 08/31 0701 - 09/01 0700 In: 2105.4 [P.O.:840; I.V.:1265.4] Out: 2630 [Urine:2630]  Intake/Output from this shift: Total I/O In: 1118.3 [P.O.:480; I.V.:88.3; IV Piggyback:550] Out: 775 [Urine:775]  Physical Exam: General appearance: alert and no distress Neck: no adenopathy, no carotid bruit, no JVD, supple, symmetrical, trachea midline and thyroid not enlarged, symmetric, no tenderness/mass/nodules Lungs: clear to auscultation bilaterally Heart: regular rate and rhythm, S1, S2 normal, no murmur, click, rub or gallop Extremities: extremities normal, atraumatic, no cyanosis or edema  Lab Results: Results for orders placed or performed during the hospital encounter of 06/13/16 (from the past 48 hour(s))  MRSA PCR Screening     Status: None   Collection Time: 06/13/16  5:29 AM  Result Value Ref Range   MRSA by PCR NEGATIVE NEGATIVE    Comment:        The GeneXpert MRSA Assay (FDA approved for NASAL specimens only), is one component of a comprehensive MRSA colonization surveillance program. It is not intended to diagnose MRSA infection nor to guide or monitor treatment for MRSA infections.   CBC     Status: Abnormal   Collection Time: 06/13/16  6:07 AM  Result Value Ref Range   WBC 10.0 4.0 - 10.5 K/uL   RBC 3.45 (L) 4.22 - 5.81 MIL/uL   Hemoglobin 10.8 (L) 13.0 - 17.0 g/dL   HCT 31.2 (L) 39.0 - 52.0 %   MCV 90.4 78.0 - 100.0 fL   MCH 31.3 26.0 - 34.0 pg   MCHC 34.6 30.0 - 36.0 g/dL   RDW 13.2 11.5 - 15.5 %   Platelets 212 150 - 400 K/uL  Troponin I (q 6hr  x 3)     Status: Abnormal   Collection Time: 06/13/16  6:07 AM  Result Value Ref Range   Troponin I 15.66 (HH) <0.03 ng/mL    Comment: CRITICAL RESULT CALLED TO, READ BACK BY AND VERIFIED WITH: J.ODDONO,RN 06/13/16 0733 BY BSLADE   Comprehensive metabolic panel     Status: Abnormal   Collection Time: 06/13/16  6:07 AM  Result Value Ref Range   Sodium 136 135 - 145 mmol/L   Potassium 3.5 3.5 - 5.1 mmol/L   Chloride 108 101 - 111 mmol/L   CO2 22 22 - 32 mmol/L   Glucose, Bld 109 (H) 65 - 99 mg/dL   BUN 6 6 - 20 mg/dL   Creatinine, Ser 0.69 0.61 - 1.24 mg/dL   Calcium 8.5 (L) 8.9 - 10.3 mg/dL   Total Protein 5.3 (L) 6.5 - 8.1 g/dL   Albumin 3.5 3.5 - 5.0 g/dL   AST 131 (H) 15 - 41 U/L   ALT 31 17 - 63 U/L   Alkaline Phosphatase 64 38 - 126 U/L   Total Bilirubin 1.4 (H) 0.3 - 1.2 mg/dL   GFR calc non Af Amer >60 >60 mL/min   GFR calc Af Amer >60 >60 mL/min    Comment: (NOTE) The eGFR has been calculated using the CKD EPI equation. This calculation has not been validated in  all clinical situations. eGFR's persistently <60 mL/min signify possible Chronic Kidney Disease.    Anion gap 6 5 - 15  Type and screen [place]     Status: None   Collection Time: 06/13/16  6:20 AM  Result Value Ref Range   ABO/RH(D) O POS    Antibody Screen NEG    Sample Expiration 06/16/2016   ABO/Rh     Status: None   Collection Time: 06/13/16  6:20 AM  Result Value Ref Range   ABO/RH(D) O POS   CBC     Status: Abnormal   Collection Time: 06/13/16  8:54 AM  Result Value Ref Range   WBC 10.1 4.0 - 10.5 K/uL   RBC 3.49 (L) 4.22 - 5.81 MIL/uL   Hemoglobin 11.0 (L) 13.0 - 17.0 g/dL   HCT 31.8 (L) 39.0 - 52.0 %   MCV 91.1 78.0 - 100.0 fL   MCH 31.5 26.0 - 34.0 pg   MCHC 34.6 30.0 - 36.0 g/dL   RDW 13.2 11.5 - 15.5 %   Platelets 197 150 - 400 K/uL  Troponin I (q 6hr x 3)     Status: Abnormal   Collection Time: 06/13/16  8:54 AM  Result Value Ref Range   Troponin I 19.35 (HH)  <0.03 ng/mL    Comment: CRITICAL VALUE NOTED.  VALUE IS CONSISTENT WITH PREVIOUSLY REPORTED AND CALLED VALUE.  Hemoglobin A1c     Status: None   Collection Time: 06/13/16  8:54 AM  Result Value Ref Range   Hgb A1c MFr Bld 5.0 4.8 - 5.6 %    Comment: (NOTE)         Pre-diabetes: 5.7 - 6.4         Diabetes: >6.4         Glycemic control for adults with diabetes: <7.0    Mean Plasma Glucose 97 mg/dL    Comment: (NOTE) Performed At: [facility] [place], [place] [id number] Lindon Romp MD TM:[id number]   CBC     Status: Abnormal   Collection Time: 06/13/16 12:04 PM  Result Value Ref Range   WBC 11.2 (H) 4.0 - 10.5 K/uL   RBC 3.30 (L) 4.22 - 5.81 MIL/uL   Hemoglobin 10.6 (L) 13.0 - 17.0 g/dL   HCT 29.8 (L) 39.0 - 52.0 %   MCV 90.3 78.0 - 100.0 fL   MCH 32.1 26.0 - 34.0 pg   MCHC 35.6 30.0 - 36.0 g/dL   RDW 13.3 11.5 - 15.5 %   Platelets 197 150 - 400 K/uL  Troponin I (q 6hr x 3)     Status: Abnormal   Collection Time: 06/13/16 12:04 PM  Result Value Ref Range   Troponin I 22.95 (HH) <0.03 ng/mL    Comment: CRITICAL VALUE NOTED.  VALUE IS CONSISTENT WITH PREVIOUSLY REPORTED AND CALLED VALUE.  Protime-INR     Status: None   Collection Time: 06/13/16 12:04 PM  Result Value Ref Range   Prothrombin Time 14.0 11.4 - 15.2 seconds   INR 1.08   Troponin I (q 6hr x 3)     Status: Abnormal   Collection Time: 06/13/16  9:08 PM  Result Value Ref Range   Troponin I 41.06 (HH) <0.03 ng/mL    Comment: CRITICAL VALUE NOTED.  VALUE IS CONSISTENT WITH PREVIOUSLY REPORTED AND CALLED VALUE.  Urinalysis, Routine w reflex microscopic (not at [facility])     Status: None   Collection Time: 06/13/16 11:42 PM  Result Value Ref Range   Color, Urine YELLOW YELLOW   APPearance CLEAR CLEAR   Specific Gravity, Urine 1.008 1.005 - 1.030   pH 7.5 5.0 - 8.0   Glucose, UA NEGATIVE NEGATIVE mg/dL   Hgb urine dipstick NEGATIVE NEGATIVE   Bilirubin Urine NEGATIVE NEGATIVE   Ketones,  ur NEGATIVE NEGATIVE mg/dL   Protein, ur NEGATIVE NEGATIVE mg/dL   Nitrite NEGATIVE NEGATIVE   Leukocytes, UA NEGATIVE NEGATIVE    Comment: MICROSCOPIC NOT DONE ON URINES WITH NEGATIVE PROTEIN, BLOOD, LEUKOCYTES, NITRITE, OR GLUCOSE <1000 mg/dL.  Basic metabolic panel     Status: Abnormal   Collection Time: 06/14/16  5:43 AM  Result Value Ref Range   Sodium 137 135 - 145 mmol/L   Potassium 3.1 (L) 3.5 - 5.1 mmol/L   Chloride 104 101 - 111 mmol/L   CO2 23 22 - 32 mmol/L   Glucose, Bld 117 (H) 65 - 99 mg/dL   BUN 6 6 - 20 mg/dL   Creatinine, Ser 0.86 0.61 - 1.24 mg/dL   Calcium 8.3 (L) 8.9 - 10.3 mg/dL   GFR calc non Af Amer >60 >60 mL/min   GFR calc Af Amer >60 >60 mL/min    Comment: (NOTE) The eGFR has been calculated using the CKD EPI equation. This calculation has not been validated in all clinical situations. eGFR's persistently <60 mL/min signify possible Chronic Kidney Disease.    Anion gap 10 5 - 15  Lipid panel     Status: Abnormal   Collection Time: 06/14/16  5:43 AM  Result Value Ref Range   Cholesterol 86 0 - 200 mg/dL   Triglycerides 82 <150 mg/dL   HDL 35 (L) >40 mg/dL   Total CHOL/HDL Ratio 2.5 RATIO   VLDL 16 0 - 40 mg/dL   LDL Cholesterol 35 0 - 99 mg/dL    Comment:        Total Cholesterol/HDL:CHD Risk Coronary Heart Disease Risk Table                     Men   Women  1/2 Average Risk   3.4   3.3  Average Risk       5.0   4.4  2 X Average Risk   9.6   7.1  3 X Average Risk  23.4   11.0        Use the calculated Patient Ratio above and the CHD Risk Table to determine the patient's CHD Risk.        ATP III CLASSIFICATION (LDL):  <100     mg/dL   Optimal  100-129  mg/dL   Near or Above                    Optimal  130-159  mg/dL   Borderline  160-189  mg/dL   High  >190     mg/dL   Very High   CBC     Status: Abnormal   Collection Time: 06/14/16  5:43 AM  Result Value Ref Range   WBC 12.7 (H) 4.0 - 10.5 K/uL   RBC 3.11 (L) 4.22 - 5.81 MIL/uL    Hemoglobin 9.7 (L) 13.0 - 17.0 g/dL   HCT 28.5 (L) 39.0 - 52.0 %   MCV 91.6 78.0 - 100.0 fL   MCH 31.2 26.0 - 34.0 pg   MCHC 34.0 30.0 - 36.0 g/dL   RDW 13.2 11.5 - 15.5 %   Platelets 184 150 -  400 K/uL    Imaging: Imaging results have been reviewed  Tele- NSR  Assessment/Plan:   1. Principal Problem: 2.   GI bleed 3. Active Problems: 4.   NSTEMI (non-ST elevated myocardial infarction) ([place]) 5.   Essential hypertension 6.   Acute blood loss anemia 7.   Non-STEMI (non-ST elevated myocardial infarction) ([place]) 8.   HCAP (healthcare-associated pneumonia) 16.   Time Spent Directly with Patient:  20 minutes  Length of Stay:  LOS: 1 day   NSTEMI secondary to occlusion of small OM branch with otherwise nl cors and LV fxn by 2D. Trop continues to climb (41). No CP. Exam benign. R radial OK. This all occurred in setting of GIB and colonoscopy. HGB 9.7 (down from 11). FLP and Hgb A1C nl. Non smoker. Would start ASA 81 mg but prob hold off on plavix for now. No further W/U necessary. Will follow up as OP.   Quay Burow 06/14/2016, 9:33 AM

## 2016-06-14 NOTE — Progress Notes (Addendum)
PROGRESS NOTE    Comer Buyer  [id number] DOB: [birth year] DOA: 06/13/2016 PCP: Teressa Lower, MD   Brief Narrative: Clayton Jones is a 60 y.o. male , relatively healthy, who underwent screening colonoscopy last Wed. Multiple hyperplastic polyps were removed from rectum and sigmoid. Two descending colon polyps 1-1.5cm were also removed. Tuesday evening patient began having painless rectal bleeding. He was admitted to [facility] and yesterday he was prepped for repeat colonoscopy for control of bleeding. During prep patient had an episode of diaphoresis, chest pain and hypotension. He had the. After returning to room from the procedure patient had an another episode of dizziness and chest pain described as tightness. The tightness was worse with deep inspiration and is greatest in right side of chest. Pain was 4/5 on scale. It improved with 2 SL NTG. He reports some tingling in RUE which also occurred last week at work. Because of the chest pain a trop was obtained and found to be elevated at 1.17 then 1.61 on recheck.  Hgb fell from 14 to 9.  He received two units of blood   Assessment & Plan:   Principal Problem:   GI bleed Active Problems:   NSTEMI (non-ST elevated myocardial infarction) ([place])   Essential hypertension   Acute blood loss anemia   Non-STEMI (non-ST elevated myocardial infarction) ([place])       NSTEMI in setting of GI bleed. Trop 0.17 >>> 1.61 >>> now 15.66. -Continue with  beta blocker and statin. -ischemic event possibly secondary to the hypotensive episode experienced after procedure.  -Patient S/P Cath 8-31; NSTEMI with ostial occlusion of OM1 branch. Otherwise, mild to moderate non-obstructive coronary artery disease. Upper normal to mildly elevated left ventricular filling pressure. -defer start anticoagulation to cardiology.  -patient is chest pain free this morning   Fever; Health care associated PNA;  Spike fever, mild elevated WBC, chest x ray with  Atelectasis. Will treat for presume PNA. Start Vancomycin and Zosyn.   Postpolypectomy bleed, s/p repeat colonoscopy with endoclip placement to site in descending colon.  No further bleeding.   Anemia of acute blood loss. Hgb fell to 9 from ~ 14. He received 2 units of blood at [place].  Hb at 9.7. No further bleeding.   Hx HTN, home BP meds recently discontinued by PCP (no longer needed)     DVT prophylaxis:  Code Status: full code.  Family Communication: care discussed with patient.  Disposition Plan: home in 24 to 48 hours.   Consultants:   Cardiology    Procedures:   Cath;  1.  NSTEMI with ostial occlusion of OM1 branch. 2.  Otherwise, mild to moderate non-obstructive coronary artery disease. 3.  Upper normal to mildly elevated left ventricular filling pressure.   Antimicrobials:   Vancomycin 9-01  Zosyn 9-01   Subjective: Feeling well, no complaints. No cough, no chest pain, no diarrhea, no dysuria.   Objective: Vitals:   06/14/16 0450 06/14/16 0530 06/14/16 0704 06/14/16 0807  BP:  107/60 (!) 96/59 103/65  Pulse:  86 83 82  Resp:   11 (!) 25  Temp: 98.5 F (36.9 C)   98.2 F (36.8 C)  TempSrc: Oral   Axillary  SpO2:   95% 97%  Weight: 87.4 kg (192 lb 10.9 oz)     Height:        Intake/Output Summary (Last 24 hours) at 06/14/16 0814 Last data filed at 06/14/16 0807  Gross per 24 hour  Intake  2345.42 ml  Output             2630 ml  Net          -284.58 ml   Filed Weights   06/13/16 0519 06/14/16 0450  Weight: 89 kg (196 lb 4.8 oz) 87.4 kg (192 lb 10.9 oz)    Examination:  General exam: Appears calm and comfortable  Respiratory system: Clear to auscultation. Respiratory effort normal. Cardiovascular system: S1 & S2 heard, RRR. No JVD, murmurs, rubs, gallops or clicks. No pedal edema. Gastrointestinal system: Abdomen is nondistended, soft and nontender. No organomegaly or masses felt. Normal bowel sounds heard. Central nervous  system: Alert and oriented. No focal neurological deficits. Extremities: Symmetric 5 x 5 power. Skin: No rashes, lesions or ulcers Psychiatry: Judgement and insight appear normal. Mood & affect appropriate.     Data Reviewed: I have personally reviewed following labs and imaging studies  CBC:  Recent Labs Lab 06/13/16 0607 06/13/16 0854 06/13/16 1204 06/14/16 0543  WBC 10.0 10.1 11.2* 12.7*  HGB 10.8* 11.0* 10.6* 9.7*  HCT 31.2* 31.8* 29.8* 28.5*  MCV 90.4 91.1 90.3 91.6  PLT 212 197 197 [id number]   Basic Metabolic Panel:  Recent Labs Lab 06/13/16 0607 06/14/16 0543  NA 136 137  K 3.5 3.1*  CL 108 104  CO2 22 23  GLUCOSE 109* 117*  BUN 6 6  CREATININE 0.69 0.86  CALCIUM 8.5* 8.3*   GFR: Estimated Creatinine Clearance: 97 mL/min (by C-G formula based on SCr of 0.86 mg/dL). Liver Function Tests:  Recent Labs Lab 06/13/16 0607  AST 131*  ALT 31  ALKPHOS 64  BILITOT 1.4*  PROT 5.3*  ALBUMIN 3.5   No results for input(s): LIPASE, AMYLASE in the last 168 hours. No results for input(s): AMMONIA in the last 168 hours. Coagulation Profile:  Recent Labs Lab 06/13/16 1204  INR 1.08   Cardiac Enzymes:  Recent Labs Lab 06/13/16 0607 06/13/16 0854 06/13/16 1204 06/13/16 2108  TROPONINI 15.66* 19.35* 22.95* 41.06*   BNP (last 3 results) No results for input(s): PROBNP in the last 8760 hours. HbA1C:  Recent Labs  06/13/16 0854  HGBA1C 5.0   CBG: No results for input(s): GLUCAP in the last 168 hours. Lipid Profile:  Recent Labs  06/14/16 0543  CHOL 86  HDL 35*  LDLCALC 35  TRIG 82  CHOLHDL 2.5   Thyroid Function Tests: No results for input(s): TSH, T4TOTAL, FREET4, T3FREE, THYROIDAB in the last 72 hours. Anemia Panel: No results for input(s): VITAMINB12, FOLATE, FERRITIN, TIBC, IRON, RETICCTPCT in the last 72 hours. Sepsis Labs: No results for input(s): PROCALCITON, LATICACIDVEN in the last 168 hours.  Recent Results (from the past 240  hour(s))  MRSA PCR Screening     Status: None   Collection Time: 06/13/16  5:29 AM  Result Value Ref Range Status   MRSA by PCR NEGATIVE NEGATIVE Final    Comment:        The GeneXpert MRSA Assay (FDA approved for NASAL specimens only), is one component of a comprehensive MRSA colonization surveillance program. It is not intended to diagnose MRSA infection nor to guide or monitor treatment for MRSA infections.          Radiology Studies: Dg Chest 2 View  Result Date: 06/14/2016 CLINICAL DATA:  Fever.  Hypertension. EXAM: CHEST  2 VIEW COMPARISON:  June 13, 2016 FINDINGS: There is slight atelectasis in the left lower lobe. Lungs elsewhere are clear. Heart size and pulmonary vascularity  are normal. No adenopathy. There are prior fractures of the posterior right eighth and ninth ribs, healed. IMPRESSION: Mild left lower lobe atelectatic change.  No edema or consolidation. Electronically Signed   By: Lowella Grip III M.D.   On: 06/14/2016 07:51   Dg Chest Port 1 View  Result Date: 06/13/2016 CLINICAL DATA:  Chest pain EXAM: PORTABLE CHEST 1 VIEW COMPARISON:  None. FINDINGS: Mild linear scarring or atelectasis in the left base. The right lung is clear. The pulmonary vasculature is normal. Hilar and mediastinal contours are unremarkable. Heart size is normal. No large effusions. IMPRESSION: Linear left base scarring or atelectasis. Electronically Signed   By: Andreas Newport M.D.   On: 06/13/2016 06:22        Scheduled Meds: . atorvastatin  80 mg Oral q1800  . metoprolol tartrate  12.5 mg Oral Q6H  . sodium chloride flush  3 mL Intravenous Q12H  . sodium chloride flush  3 mL Intravenous Q12H   Continuous Infusions: . sodium chloride 100 mL/hr at 06/14/16 0807     LOS: 1 day    Time spent: 35 minutes.     Elmarie Shiley, MD [facility] Pager [phone]  If 7PM-7AM, please contact night-coverage [email or web address] Password [email or web address] 06/14/2016, 8:14 AM

## 2016-06-14 NOTE — Plan of Care (Signed)
Problem: Physical Regulation: Goal: Will remain free from infection Outcome: Not Progressing Pt spiked a temp this shift. Highest temp recorded was 102.2. Tylenol administered, temp reduced to 99.3. MD notified and order for urinalysis and blood cultures obtained.   Problem: Phase III Progression Outcomes Goal: No anginal pain Outcome: Progressing No chest pain noted.  Goal: Vascular site scale level 0 - I Vascular Site Scale Level 0: No bruising/bleeding/hematoma Level I (Mild): Bruising/Ecchymosis, minimal bleeding/ooozing, palpable hematoma < 3 cm Level II (Moderate): Bleeding not affecting hemodynamic parameters, pseudoaneurysm, palpable hematoma > 3 cm Level III  (Severe) Bleeding which affects hemodynamic parameters or retroperitoneal hemorrhage   Outcome: Progressing Right radial level "0"

## 2016-06-15 DIAGNOSIS — J189 Pneumonia, unspecified organism: Secondary | ICD-10-CM

## 2016-06-15 DIAGNOSIS — R0789 Other chest pain: Secondary | ICD-10-CM

## 2016-06-15 DIAGNOSIS — K922 Gastrointestinal hemorrhage, unspecified: Secondary | ICD-10-CM

## 2016-06-15 DIAGNOSIS — R079 Chest pain, unspecified: Secondary | ICD-10-CM

## 2016-06-15 LAB — BASIC METABOLIC PANEL
ANION GAP: 7 (ref 5–15)
BUN: 8 mg/dL (ref 6–20)
CALCIUM: 8 mg/dL — AB (ref 8.9–10.3)
CO2: 21 mmol/L — ABNORMAL LOW (ref 22–32)
Chloride: 109 mmol/L (ref 101–111)
Creatinine, Ser: 0.92 mg/dL (ref 0.61–1.24)
Glucose, Bld: 116 mg/dL — ABNORMAL HIGH (ref 65–99)
Potassium: 3.4 mmol/L — ABNORMAL LOW (ref 3.5–5.1)
Sodium: 137 mmol/L (ref 135–145)

## 2016-06-15 LAB — CBC
HEMATOCRIT: 25.1 % — AB (ref 39.0–52.0)
Hemoglobin: 8.8 g/dL — ABNORMAL LOW (ref 13.0–17.0)
MCH: 32.5 pg (ref 26.0–34.0)
MCHC: 35.1 g/dL (ref 30.0–36.0)
MCV: 92.6 fL (ref 78.0–100.0)
PLATELETS: 145 10*3/uL — AB (ref 150–400)
RBC: 2.71 MIL/uL — AB (ref 4.22–5.81)
RDW: 13.2 % (ref 11.5–15.5)
WBC: 11.5 10*3/uL — AB (ref 4.0–10.5)

## 2016-06-15 LAB — VANCOMYCIN, TROUGH: VANCOMYCIN TR: 15 ug/mL (ref 15–20)

## 2016-06-15 LAB — HEMOGLOBIN AND HEMATOCRIT, BLOOD
HEMATOCRIT: 26.4 % — AB (ref 39.0–52.0)
HEMOGLOBIN: 8.8 g/dL — AB (ref 13.0–17.0)

## 2016-06-15 MED ORDER — ZOLPIDEM TARTRATE 5 MG PO TABS
5.0000 mg | ORAL_TABLET | Freq: Once | ORAL | Status: AC
  Administered 2016-06-15: 5 mg via ORAL
  Filled 2016-06-15: qty 1

## 2016-06-15 MED ORDER — POLYETHYLENE GLYCOL 3350 17 G PO PACK
17.0000 g | PACK | Freq: Every day | ORAL | Status: DC
  Administered 2016-06-15: 17 g via ORAL
  Filled 2016-06-15: qty 1

## 2016-06-15 MED ORDER — POTASSIUM CHLORIDE CRYS ER 20 MEQ PO TBCR
40.0000 meq | EXTENDED_RELEASE_TABLET | Freq: Once | ORAL | Status: AC
  Administered 2016-06-15: 40 meq via ORAL
  Filled 2016-06-15: qty 2

## 2016-06-15 NOTE — Progress Notes (Signed)
NSTEMI - cath showed occlusion of a small vessel. Elevated troponin may be in part secondary to GIB. Agree with restarting aspirin as long as HGB stable. Transfuse to HGB>9 if the patient is having unstable angina, otherwise, not indicated. Dr. Gwenlyn Found to see in follow-up as an outpatient. Cardiology will sign-off. Call with questions.  Pixie Casino, MD, [facility] Attending Cardiologist [place]

## 2016-06-15 NOTE — Progress Notes (Addendum)
PROGRESS NOTE    Clayton Jones  [id number] DOB: 02-17-1956 DOA: 06/13/2016 PCP: Teressa Lower, MD   Brief Narrative: Clayton Jones is a 60 y.o. male , relatively healthy, who underwent screening colonoscopy last Wed. Multiple hyperplastic polyps were removed from rectum and sigmoid. Two descending colon polyps 1-1.5cm were also removed. Tuesday evening patient began having painless rectal bleeding. He was admitted to [facility] and yesterday he was prepped for repeat colonoscopy for control of bleeding. During prep patient had an episode of diaphoresis, chest pain and hypotension. He had the. After returning to room from the procedure patient had an another episode of dizziness and chest pain described as tightness. The tightness was worse with deep inspiration and is greatest in right side of chest. Pain was 4/5 on scale. It improved with 2 SL NTG. He reports some tingling in RUE which also occurred last week at work. Because of the chest pain a trop was obtained and found to be elevated at 1.17 then 1.61 on recheck.  Hgb fell from 14 to 9.  He received two units of blood   Assessment & Plan:   Principal Problem:   GI bleed Active Problems:   NSTEMI (non-ST elevated myocardial infarction) ([place])   Essential hypertension   Acute blood loss anemia   Non-STEMI (non-ST elevated myocardial infarction) ([place])   HCAP (healthcare-associated pneumonia)       NSTEMI in setting of GI bleed. Trop 0.17 >>> 1.61 >>> now 15.66. -Continue with  beta blocker and statin. -ischemic event possibly secondary to the hypotensive episode experienced after procedure.  -Patient S/P Cath 8-31; NSTEMI with ostial occlusion of OM1 branch. Otherwise, mild to moderate non-obstructive coronary artery disease. Upper normal to mildly elevated left ventricular filling pressure. -defer start anticoagulation to cardiology. Patient started on baby aspirin.  -patient is chest pain free this morning   Fever; Health  care associated PNA;  Spike fever, mild elevated WBC, chest x ray with Atelectasis 9-01 Treating for for presume PNA.  Continue with  Vancomycin and Zosyn day 2.  Had fever this am. Continue with IV antibiotics.    Postpolypectomy bleed, s/p repeat colonoscopy with endoclip placement to site in descending colon.  No further bleeding, but hb trending down.    Anemia of acute blood loss. Hgb fell to 9 from ~ 14. He received 2 units of blood at [place].  Hb decreased to 8.8, repeat this afternoon if trending down will order another nit PRBC.   Hx HTN, home BP meds recently discontinued by PCP (no longer needed)     DVT prophylaxis:  Code Status: full code.  Family Communication: care discussed with patient.  Disposition Plan: home in 24 to 48 hours when afebrile.   Consultants:   Cardiology    Procedures:   Cath;  1.  NSTEMI with ostial occlusion of OM1 branch. 2.  Otherwise, mild to moderate non-obstructive coronary artery disease. 3.  Upper normal to mildly elevated left ventricular filling pressure.   Antimicrobials:   Vancomycin 9-01  Zosyn 9-01   Subjective: He has no complaints. He denies chest pain. No BM since admission, no further bleeding.   Objective: Vitals:   06/14/16 2315 06/15/16 0100 06/15/16 0524 06/15/16 0807  BP:  (!) 95/59 109/67 98/64  Pulse:  83 89 82  Resp:  19 20   Temp: 97.6 F (36.4 C) (!) 100.5 F (38.1 C) (!) 101.7 F (38.7 C) 98.3 F (36.8 C)  TempSrc: Oral   Oral  SpO2:  97% 97% 98%  Weight:   89 kg (196 lb 3.2 oz)   Height:        Intake/Output Summary (Last 24 hours) at 06/15/16 1013 Last data filed at 06/15/16 0500  Gross per 24 hour  Intake             3140 ml  Output             1200 ml  Net             1940 ml   Filed Weights   06/13/16 0519 06/14/16 0450 06/15/16 0524  Weight: 89 kg (196 lb 4.8 oz) 87.4 kg (192 lb 10.9 oz) 89 kg (196 lb 3.2 oz)    Examination:  General exam: Appears calm and comfortable    Respiratory system: Clear to auscultation. Respiratory effort normal. Cardiovascular system: S1 & S2 heard, RRR. No JVD, murmurs, rubs, gallops or clicks. No pedal edema. Gastrointestinal system: Abdomen is nondistended, soft and nontender. No organomegaly or masses felt. Normal bowel sounds heard. Central nervous system: Alert and oriented. No focal neurological deficits. Extremities: Symmetric 5 x 5 power. Skin: No rashes, lesions or ulcers Psychiatry: Judgement and insight appear normal. Mood & affect appropriate.     Data Reviewed: I have personally reviewed following labs and imaging studies  CBC:  Recent Labs Lab 06/13/16 0607 06/13/16 0854 06/13/16 1204 06/14/16 0543 06/15/16 0315  WBC 10.0 10.1 11.2* 12.7* 11.5*  HGB 10.8* 11.0* 10.6* 9.7* 8.8*  HCT 31.2* 31.8* 29.8* 28.5* 25.1*  MCV 90.4 91.1 90.3 91.6 92.6  PLT 212 197 197 184 [id number]*   Basic Metabolic Panel:  Recent Labs Lab 06/13/16 0607 06/14/16 0543 06/15/16 0315  NA 136 137 137  K 3.5 3.1* 3.4*  CL 108 104 109  CO2 22 23 21*  GLUCOSE 109* 117* 116*  BUN 6 6 8   CREATININE 0.69 0.86 0.92  CALCIUM 8.5* 8.3* 8.0*   GFR: Estimated Creatinine Clearance: 91.4 mL/min (by C-G formula based on SCr of 0.92 mg/dL). Liver Function Tests:  Recent Labs Lab 06/13/16 0607  AST 131*  ALT 31  ALKPHOS 64  BILITOT 1.4*  PROT 5.3*  ALBUMIN 3.5   No results for input(s): LIPASE, AMYLASE in the last 168 hours. No results for input(s): AMMONIA in the last 168 hours. Coagulation Profile:  Recent Labs Lab 06/13/16 1204  INR 1.08   Cardiac Enzymes:  Recent Labs Lab 06/13/16 0607 06/13/16 0854 06/13/16 1204 06/13/16 2108  TROPONINI 15.66* 19.35* 22.95* 41.06*   BNP (last 3 results) No results for input(s): PROBNP in the last 8760 hours. HbA1C:  Recent Labs  06/13/16 0854  HGBA1C 5.0   CBG: No results for input(s): GLUCAP in the last 168 hours. Lipid Profile:  Recent Labs  06/14/16 0543   CHOL 86  HDL 35*  LDLCALC 35  TRIG 82  CHOLHDL 2.5   Thyroid Function Tests: No results for input(s): TSH, T4TOTAL, FREET4, T3FREE, THYROIDAB in the last 72 hours. Anemia Panel: No results for input(s): VITAMINB12, FOLATE, FERRITIN, TIBC, IRON, RETICCTPCT in the last 72 hours. Sepsis Labs: No results for input(s): PROCALCITON, LATICACIDVEN in the last 168 hours.  Recent Results (from the past 240 hour(s))  MRSA PCR Screening     Status: None   Collection Time: 06/13/16  5:29 AM  Result Value Ref Range Status   MRSA by PCR NEGATIVE NEGATIVE Final    Comment:        The GeneXpert MRSA Assay (FDA approved  for NASAL specimens only), is one component of a comprehensive MRSA colonization surveillance program. It is not intended to diagnose MRSA infection nor to guide or monitor treatment for MRSA infections.          Radiology Studies: Dg Chest 2 View  Result Date: 06/14/2016 CLINICAL DATA:  Fever.  Hypertension. EXAM: CHEST  2 VIEW COMPARISON:  June 13, 2016 FINDINGS: There is slight atelectasis in the left lower lobe. Lungs elsewhere are clear. Heart size and pulmonary vascularity are normal. No adenopathy. There are prior fractures of the posterior right eighth and ninth ribs, healed. IMPRESSION: Mild left lower lobe atelectatic change.  No edema or consolidation. Electronically Signed   By: Lowella Grip III M.D.   On: 06/14/2016 07:51        Scheduled Meds: . atorvastatin  80 mg Oral q1800  . metoprolol tartrate  12.5 mg Oral Q6H  . piperacillin-tazobactam (ZOSYN)  IV  3.375 g Intravenous Q8H  . polyethylene glycol  17 g Oral Daily  . sodium chloride flush  3 mL Intravenous Q12H  . sodium chloride flush  3 mL Intravenous Q12H  . vancomycin  1,000 mg Intravenous Q8H   Continuous Infusions: . sodium chloride 100 mL/hr at 06/15/16 0411     LOS: 2 days    Time spent: 35 minutes.     Elmarie Shiley, MD [facility] Pager [phone]  If  7PM-7AM, please contact night-coverage [email or web address] Password [email or web address] 06/15/2016, 10:13 AM

## 2016-06-15 NOTE — Progress Notes (Signed)
Pharmacy Antibiotic Note  Clayton Jones is a 60 y.o. male admitted on 06/13/2016 with NSTEMI. To start empiric vancomycin/zosyn for suspected PNA.   Vancomycin drawn this afternoon- resulted at 58mcg/mL. Drawn appropriately. Renal function overall stable.  Plan: -Continue Vancomycin 1000mg  IV q8h -Zosyn 3.375g q8h EI -F/U cultures, renal functionn, clinical status, LOT  Height: 5\' 8"  (172.7 cm) Weight: 196 lb 3.2 oz (89 kg) IBW/kg (Calculated) : 68.4  Temp (24hrs), Avg:99.6 F (37.6 C), Min:97.6 F (36.4 C), Max:101.7 F (38.7 C)   Recent Labs Lab 06/13/16 0607 06/13/16 0854 06/13/16 1204 06/14/16 0543 06/15/16 0315 06/15/16 1548  WBC 10.0 10.1 11.2* 12.7* 11.5*  --   CREATININE 0.69  --   --  0.86 0.92  --   VANCOTROUGH  --   --   --   --   --  15    Estimated Creatinine Clearance: 91.4 mL/min (by C-G formula based on SCr of 0.92 mg/dL).    No Known Allergies  Antimicrobials this admission: Vancomycin 9/1 >> Zosyn 9/1 >>   Dose Changes: 9/2 Vancomycin Trough = 15 on 1g IV q8h- no changes  Microbiology results: 8/31 BCx: sent 8/31 MRSA PCR: neg   Thank you for allowing pharmacy to be a part of this patient's care.  Viaan Knippenberg D. Terica Yogi, PharmD, BCPS Clinical Pharmacist Pager: [phone] 06/15/2016 5:06 PM

## 2016-06-16 DIAGNOSIS — R072 Precordial pain: Secondary | ICD-10-CM

## 2016-06-16 LAB — BASIC METABOLIC PANEL
Anion gap: 6 (ref 5–15)
BUN: 7 mg/dL (ref 6–20)
CALCIUM: 8 mg/dL — AB (ref 8.9–10.3)
CO2: 22 mmol/L (ref 22–32)
CREATININE: 0.87 mg/dL (ref 0.61–1.24)
Chloride: 110 mmol/L (ref 101–111)
GFR calc non Af Amer: >60 mL/min (ref >60)
Glucose, Bld: 109 mg/dL — ABNORMAL HIGH (ref 65–99)
Potassium: 3.3 mmol/L — ABNORMAL LOW (ref 3.5–5.1)
SODIUM: 138 mmol/L (ref 135–145)

## 2016-06-16 LAB — CBC
HCT: 24.8 % — ABNORMAL LOW (ref 39.0–52.0)
Hemoglobin: 8.4 g/dL — ABNORMAL LOW (ref 13.0–17.0)
MCH: 31.5 pg (ref 26.0–34.0)
MCHC: 33.9 g/dL (ref 30.0–36.0)
MCV: 92.9 fL (ref 78.0–100.0)
PLATELETS: 174 10*3/uL (ref 150–400)
RBC: 2.67 MIL/uL — ABNORMAL LOW (ref 4.22–5.81)
RDW: 13.2 % (ref 11.5–15.5)
WBC: 9.1 10*3/uL (ref 4.0–10.5)

## 2016-06-16 LAB — HEMOGLOBIN AND HEMATOCRIT, BLOOD
HEMATOCRIT: 25.7 % — AB (ref 39.0–52.0)
HEMOGLOBIN: 8.7 g/dL — AB (ref 13.0–17.0)

## 2016-06-16 MED ORDER — LEVOFLOXACIN 750 MG PO TABS: 750.0000 mg | ORAL_TABLET | Freq: Every day | ORAL | 0 refills | Status: DC

## 2016-06-16 MED ORDER — FERROUS SULFATE 325 (65 FE) MG PO TABS
325.0000 mg | ORAL_TABLET | Freq: Two times a day (BID) | ORAL | Status: DC
  Administered 2016-06-16: 325 mg via ORAL
  Filled 2016-06-16: qty 1

## 2016-06-16 MED ORDER — METOPROLOL TARTRATE 12.5 MG HALF TABLET
12.5000 mg | ORAL_TABLET | Freq: Two times a day (BID) | ORAL | Status: DC
  Filled 2016-06-16: qty 1

## 2016-06-16 MED ORDER — FERROUS SULFATE 325 (65 FE) MG PO TABS: 325.0000 mg | ORAL_TABLET | Freq: Two times a day (BID) | ORAL | 3 refills | Status: DC

## 2016-06-16 MED ORDER — POTASSIUM CHLORIDE CRYS ER 20 MEQ PO TBCR
40.0000 meq | EXTENDED_RELEASE_TABLET | Freq: Once | ORAL | Status: AC
  Administered 2016-06-16: 40 meq via ORAL
  Filled 2016-06-16: qty 2

## 2016-06-16 MED ORDER — POLYETHYLENE GLYCOL 3350 17 G PO PACK: 17.0000 g | PACK | Freq: Every day | ORAL | 0 refills | Status: DC

## 2016-06-16 MED ORDER — ASPIRIN EC 81 MG PO TBEC: 81.0000 mg | DELAYED_RELEASE_TABLET | Freq: Every day | ORAL | Status: DC

## 2016-06-16 MED ORDER — POTASSIUM CHLORIDE 20 MEQ PO PACK: 40.0000 meq | PACK | Freq: Every day | ORAL | 0 refills | Status: DC

## 2016-06-16 MED ORDER — ASPIRIN 81 MG PO TBEC: 81.0000 mg | DELAYED_RELEASE_TABLET | Freq: Every day | ORAL | 0 refills | Status: AC

## 2016-06-16 MED ORDER — METOPROLOL TARTRATE 25 MG PO TABS: 12.5000 mg | ORAL_TABLET | Freq: Two times a day (BID) | ORAL | 0 refills | Status: DC

## 2016-06-16 MED ORDER — ATORVASTATIN CALCIUM 80 MG PO TABS: 80.0000 mg | ORAL_TABLET | Freq: Every day | ORAL | 0 refills | Status: DC

## 2016-06-16 NOTE — Discharge Summary (Signed)
Physician Discharge Summary  Clayton Jones [id number] DOB: 1956/03/20 DOA: 06/13/2016  PCP: Teressa Lower, MD  Admit date: 06/13/2016 Discharge date: 06/16/2016  Admitted From: Home  Disposition: Home  Recommendations for Outpatient Follow-up:  1. Follow up with PCP within 1 week to check CBC  2. Please obtain BMP/CBC in one week 3. Follow up with cardiology    Discharge Condition: stable.  CODE STATUS: Full code  Diet recommendation: Heart Healthy  Brief/Interim Summary:  Brief Narrative: Clayton Jones a 60 Jones, relatively healthy, who underwent screening colonoscopy last Wed. Multiple hyperplastic polyps were removed from rectum and sigmoid. Two descending colon polyps 1-1.5cm were also removed. Tuesday evening patient began having painless rectal bleeding. He was admitted to [facility] and yesterday he was prepped for repeat colonoscopy for control of bleeding. During prep patient had an episode of diaphoresis, chest pain and hypotension. He had the. After returning to room from the procedure patient had an another episode of dizziness and chest pain described as tightness. The tightness was worse with deep inspiration and is greatest in right side of chest. Pain was 4/5 on scale. It improved with 2 SL NTG. He reports some tingling in RUE which also occurred last week at work. Because of the chest pain a trop was obtained and found to be elevated at 1.17 then1.61 on recheck. Hgb fell from 14 to 9. He received two units of blood   Assessment & Plan:   Principal Problem:   GI bleed Active Problems:   NSTEMI (non-ST elevated myocardial infarction) ([place])   Essential hypertension   Acute blood loss anemia   Non-STEMI (non-ST elevated myocardial infarction) ([place])   HCAP (healthcare-associated pneumonia)   NSTEMIin setting of GI bleed. Trop 0.17 >>>1.61 >>>now 15.66. -Continue with  beta blocker and statin. -ischemic event possibly secondary to the  hypotensive episode experienced after procedure.  -Patient S/P Cath 8-31; NSTEMI with ostial occlusion of OM1 branch. Otherwise, mild to moderate non-obstructive coronary artery disease. Upper normal to mildly elevated left ventricular filling pressure. - Patient to be started on baby aspirin.  -patient is chest pain free this morning  hb stable.  -metoprolol, statins.   Fever; Health care associated PNA;  Spike fever, mild elevated WBC, chest x ray with Atelectasis 9-01 Treating for for presume PNA.  Continue with  Vancomycin and Zosyn day 3.  Discharge on levaquin for 5 days,.  afebrile.   Postpolypectomy bleed, s/p repeat colonoscopy with endoclip placement to site in descending colon.  No further bleeding, hb stable.    Anemia of acute blood loss.Hgb fell to 9 from ~ 14. He received 2 units of blood at [place].  Hb stable at 8.7 over 24 hours.  Discharge on ferrous sulfate.  Need CBC in 3 days   Hx HTN,home BP meds recently discontinued by PCP (no longer needed)  hypokalemia; Kcl supplement provide   Discharge Diagnoses:  Principal Problem:   GI bleed Active Problems:   NSTEMI (non-ST elevated myocardial infarction) ([place])   Essential hypertension   Acute blood loss anemia   Non-STEMI (non-ST elevated myocardial infarction) ([facility])   HCAP (healthcare-associated pneumonia)   Chest pain    Discharge Instructions  Discharge Instructions    Diet - low sodium heart healthy    Complete by:  As directed   Increase activity slowly    Complete by:  As directed       Medication List    TAKE these medications   aspirin 81 MG EC tablet Take  1 tablet (81 mg total) by mouth daily. Start taking on:  06/17/2016   atorvastatin 80 MG tablet Commonly known as:  LIPITOR Take 1 tablet (80 mg total) by mouth daily at 6 PM.   ferrous sulfate 325 (65 FE) MG tablet Take 1 tablet (325 mg total) by mouth 2 (two) times daily with a meal.   FISH OIL PO Take 1 capsule by mouth  daily.   levofloxacin 750 MG tablet Commonly known as:  LEVAQUIN Take 1 tablet (750 mg total) by mouth daily.   metoprolol tartrate 25 MG tablet Commonly known as:  LOPRESSOR Take 0.5 tablets (12.5 mg total) by mouth 2 (two) times daily. Start taking on:  06/17/2016   MULTIVITAMIN PO Take 1 tablet by mouth daily.   polyethylene glycol packet Commonly known as:  MIRALAX / GLYCOLAX Take 17 g by mouth daily.   potassium chloride 20 MEQ packet Commonly known as:  KLOR-CON Take 40 mEq by mouth daily.      Follow-up Hern[place], MD .   Specialty:  Unknown Physician Specialty Contact information: [place] [address] [phone]        Quay Burow, MD .   Specialties:  Cardiology, Radiology Why:  The office will call. Contact information: [address] [place] [address] [phone]          No Known Allergies  Consultations:  Cardiology    Procedures/Studies: Dg Chest 2 View  Result Date: 06/14/2016 CLINICAL DATA:  Fever.  Hypertension. EXAM: CHEST  2 VIEW COMPARISON:  June 13, 2016 FINDINGS: There is slight atelectasis in the left lower lobe. Lungs elsewhere are clear. Heart size and pulmonary vascularity are normal. No adenopathy. There are prior fractures of the posterior right eighth and ninth ribs, healed. IMPRESSION: Mild left lower lobe atelectatic change.  No edema or consolidation. Electronically Signed   By: Lowella Grip III M.D.   On: 06/14/2016 07:51   Dg Chest Port 1 View  Result Date: 06/13/2016 CLINICAL DATA:  Chest pain EXAM: PORTABLE CHEST 1 VIEW COMPARISON:  None. FINDINGS: Mild linear scarring or atelectasis in the left base. The right lung is clear. The pulmonary vasculature is normal. Hilar and mediastinal contours are unremarkable. Heart size is normal. No large effusions. IMPRESSION: Linear left base scarring or atelectasis. Electronically Signed   By: Andreas Newport M.D.   On:  06/13/2016 06:22      Subjective: Feeling well, denies chest pain, had 2 bm. No blood in the stool/   Discharge Exam: Vitals:   06/16/16 0737 06/16/16 1149  BP: 112/77 115/70  Pulse: 82 88  Resp: 18 17  Temp: 99.1 F (37.3 C) 99.4 F (37.4 C)   Vitals:   06/16/16 0030 06/16/16 0400 06/16/16 0737 06/16/16 1149  BP: 114/74 110/76 112/77 115/70  Pulse: 88 83 82 88  Resp: 16 17 18 17   Temp: 99.7 F (37.6 C) 99.7 F (37.6 C) 99.1 F (37.3 C) 99.4 F (37.4 C)  TempSrc:   Oral Oral  SpO2: 96% 95% 96% 98%  Weight:  89.9 kg (198 lb 3.2 oz)    Height:        General: Pt is alert, awake, not in acute distress Cardiovascular: RRR, S1/S2 +, no rubs, no gallops Respiratory: CTA bilaterally, no wheezing, no rhonchi Abdominal: Soft, NT, ND, bowel sounds + Extremities: no edema, no cyanosis    The results of significant diagnostics from this hospitalization (including imaging, microbiology, ancillary  and laboratory) are listed below for reference.     Microbiology: Recent Results (from the past 240 hour(s))  MRSA PCR Screening     Status: None   Collection Time: 06/13/16  5:29 AM  Result Value Ref Range Status   MRSA by PCR NEGATIVE NEGATIVE Final    Comment:        The GeneXpert MRSA Assay (FDA approved for NASAL specimens only), is one component of a comprehensive MRSA colonization surveillance program. It is not intended to diagnose MRSA infection nor to guide or monitor treatment for MRSA infections.   Culture, blood (routine x 2)     Status: None (Preliminary result)   Collection Time: 06/13/16 11:34 PM  Result Value Ref Range Status   Specimen Description BLOOD LEFT HAND  Final   Special Requests IN PEDIATRIC BOTTLE 4ML  Final   Culture NO GROWTH 1 DAY  Final   Report Status PENDING  Incomplete  Culture, blood (routine x 2)     Status: None (Preliminary result)   Collection Time: 06/13/16 11:55 PM  Result Value Ref Range Status   Specimen Description BLOOD  LEFT WRIST  Final   Special Requests AEROBIC BOTTLE ONLY 7ML  Final   Culture NO GROWTH 1 DAY  Final   Report Status PENDING  Incomplete     Labs: BNP (last 3 results) No results for input(s): BNP in the last 8760 hours. Basic Metabolic Panel:  Recent Labs Lab 06/13/16 0607 06/14/16 0543 06/15/16 0315 06/16/16 0543  NA 136 137 137 138  K 3.5 3.1* 3.4* 3.3*  CL 108 104 109 110  CO2 22 23 21* 22  GLUCOSE 109* 117* 116* 109*  BUN 6 6 8 7   CREATININE 0.69 0.86 0.92 0.87  CALCIUM 8.5* 8.3* 8.0* 8.0*   Liver Function Tests:  Recent Labs Lab 06/13/16 0607  AST 131*  ALT 31  ALKPHOS 64  BILITOT 1.4*  PROT 5.3*  ALBUMIN 3.5   No results for input(s): LIPASE, AMYLASE in the last 168 hours. No results for input(s): AMMONIA in the last 168 hours. CBC:  Recent Labs Lab 06/13/16 0854 06/13/16 1204 06/14/16 0543 06/15/16 0315 06/15/16 1548 06/16/16 0543 06/16/16 1428  WBC 10.1 11.2* 12.7* 11.5*  --  9.1  --   HGB 11.0* 10.6* 9.7* 8.8* 8.8* 8.4* 8.7*  HCT 31.8* 29.8* 28.5* 25.1* 26.4* 24.8* 25.7*  MCV 91.1 90.3 91.6 92.6  --  92.9  --   PLT 197 197 184 145*  --  174  --    Cardiac Enzymes:  Recent Labs Lab 06/13/16 0607 06/13/16 0854 06/13/16 1204 06/13/16 2108  TROPONINI 15.66* 19.35* 22.95* 41.06*   BNP: Invalid input(s): POCBNP CBG: No results for input(s): GLUCAP in the last 168 hours. D-Dimer No results for input(s): DDIMER in the last 72 hours. Hgb A1c No results for input(s): HGBA1C in the last 72 hours. Lipid Profile  Recent Labs  06/14/16 0543  CHOL 86  HDL 35*  LDLCALC 35  TRIG 82  CHOLHDL 2.5   Thyroid function studies No results for input(s): TSH, T4TOTAL, T3FREE, THYROIDAB in the last 72 hours.  Invalid input(s): FREET3 Anemia work up No results for input(s): VITAMINB12, FOLATE, FERRITIN, TIBC, IRON, RETICCTPCT in the last 72 hours. Urinalysis    Component Value Date/Time   COLORURINE YELLOW 06/13/2016 [place] 06/13/2016 2342   LABSPEC 1.008 06/13/2016 2342   PHURINE 7.5 06/13/2016 [place] NEGATIVE 06/13/2016 2342  HGBUR NEGATIVE 06/13/2016 [place] NEGATIVE 06/13/2016 2342   KETONESUR NEGATIVE 06/13/2016 2342   PROTEINUR NEGATIVE 06/13/2016 2342   NITRITE NEGATIVE 06/13/2016 2342   LEUKOCYTESUR NEGATIVE 06/13/2016 2342   Sepsis Labs Invalid input(s): PROCALCITONIN,  WBC,  LACTICIDVEN Microbiology Recent Results (from the past 240 hour(s))  MRSA PCR Screening     Status: None   Collection Time: 06/13/16  5:29 AM  Result Value Ref Range Status   MRSA by PCR NEGATIVE NEGATIVE Final    Comment:        The GeneXpert MRSA Assay (FDA approved for NASAL specimens only), is one component of a comprehensive MRSA colonization surveillance program. It is not intended to diagnose MRSA infection nor to guide or monitor treatment for MRSA infections.   Culture, blood (routine x 2)     Status: None (Preliminary result)   Collection Time: 06/13/16 11:34 PM  Result Value Ref Range Status   Specimen Description BLOOD LEFT HAND  Final   Special Requests IN PEDIATRIC BOTTLE 4ML  Final   Culture NO GROWTH 1 DAY  Final   Report Status PENDING  Incomplete  Culture, blood (routine x 2)     Status: None (Preliminary result)   Collection Time: 06/13/16 11:55 PM  Result Value Ref Range Status   Specimen Description BLOOD LEFT WRIST  Final   Special Requests AEROBIC BOTTLE ONLY 7ML  Final   Culture NO GROWTH 1 DAY  Final   Report Status PENDING  Incomplete     Time coordinating discharge: Over 30 minutes  SIGNED:   Elmarie Shiley, MD  [facility] 06/16/2016, 3:34 PM Pager [phone]  If 7PM-7AM, please contact night-coverage [email or web address] Password [email or web address]

## 2016-06-18 NOTE — Telephone Encounter (Signed)
Patient contacted regarding discharge from [facility] on 06/16/16.    Patient understands to follow up with provider Ignacia Bayley NP on 07/02/16 at 10:30 AM at [facility] location and follow up at 10 AM with Pharmacist at [facility] location prior.    Patient understands discharge instructions? yes  Patient understands medications and regiment? yes  Patient understands to bring all medications to this visit? yes    Pt reports he is doing well and continues to regain his strength.  Advised to call with any questions/concerns prior to appt if needed.  Pt verbalized understanding.

## 2016-06-18 NOTE — Telephone Encounter (Signed)
TOC call-pt states he is in car and request to call back in 15-30 mins.

## 2016-06-19 LAB — CULTURE, BLOOD (ROUTINE X 2)
Culture: NO GROWTH
Culture: NO GROWTH

## 2016-06-20 DIAGNOSIS — I214 Non-ST elevation (NSTEMI) myocardial infarction: Secondary | ICD-10-CM | POA: Diagnosis not present

## 2016-06-20 DIAGNOSIS — E782 Mixed hyperlipidemia: Secondary | ICD-10-CM | POA: Diagnosis not present

## 2016-06-20 DIAGNOSIS — I10 Essential (primary) hypertension: Secondary | ICD-10-CM | POA: Diagnosis not present

## 2016-06-20 DIAGNOSIS — I2510 Atherosclerotic heart disease of native coronary artery without angina pectoris: Secondary | ICD-10-CM | POA: Diagnosis not present

## 2016-06-20 DIAGNOSIS — D62 Acute posthemorrhagic anemia: Secondary | ICD-10-CM | POA: Diagnosis not present

## 2016-07-02 ENCOUNTER — Ambulatory Visit (INDEPENDENT_AMBULATORY_CARE_PROVIDER_SITE_OTHER): Payer: BLUE CROSS/BLUE SHIELD | Admitting: Student

## 2016-07-02 ENCOUNTER — Encounter: Payer: Self-pay | Admitting: Student

## 2016-07-02 ENCOUNTER — Ambulatory Visit (INDEPENDENT_AMBULATORY_CARE_PROVIDER_SITE_OTHER): Payer: BLUE CROSS/BLUE SHIELD | Admitting: Pharmacist

## 2016-07-02 VITALS — BP 128/82 | HR 64 | Ht 68.0 in | Wt 193.0 lb

## 2016-07-02 DIAGNOSIS — I10 Essential (primary) hypertension: Secondary | ICD-10-CM

## 2016-07-02 DIAGNOSIS — I214 Non-ST elevation (NSTEMI) myocardial infarction: Secondary | ICD-10-CM

## 2016-07-02 DIAGNOSIS — E876 Hypokalemia: Secondary | ICD-10-CM | POA: Diagnosis not present

## 2016-07-02 DIAGNOSIS — Z8719 Personal history of other diseases of the digestive system: Secondary | ICD-10-CM

## 2016-07-02 MED ORDER — METOPROLOL TARTRATE 25 MG PO TABS: 12.5000 mg | ORAL_TABLET | Freq: Two times a day (BID) | ORAL | 3 refills | Status: DC

## 2016-07-02 MED ORDER — METOPROLOL TARTRATE 25 MG PO TABS: 12.5000 mg | ORAL_TABLET | Freq: Two times a day (BID) | ORAL | 11 refills | Status: DC

## 2016-07-02 NOTE — Patient Instructions (Addendum)
Medications for a Healthy Heart        My medicines help me. Live longer Feel better Stay out of the hospital      My prescription  Beta blocker    metoprolol  Beta blockers make your heart strong by blocking chemicals that make your heart work too hard. They may cause a slow heartbeat and low blood pressure.   Statin     atorvastatin  Statins will help to lower your cholesterol and prevent the build-up of plaque in your arteries that can lead to another heart attack and cause heart disease. Let your doctor know if you feel any muscle aches or pains.  Aspirin     81mg   Aspirin helps to prevent future heart attacks and heart disease. Because it is an anti-platelet drug, it makes the blood thinner and may cause bleeds. Let your doctor know if it looks like there is blood in your stool or if you have any upcoming procedures or surgeries.  Anti-platelet      May start in the future when cleared by GI for bleeding issues.   You may be on another anti-platelet drug for at least a year after your heart attack. It works with aspirin to keep your blood thin and prevent future heart attacks. It may also cause bleeding-let your doctor know if  it looks like you have blood in your stool or if you have any upcoming procedures or surgeries.    And remember.  ? Cardiac rehab, exercise, and quitting smoking are some of the best ways to make your heart stronger and help you to live longer. ? Call us if you are interested in counseling or medications to help you quit smoking.

## 2016-07-02 NOTE — Progress Notes (Signed)
Patient ID: Clayton Jones                 DOB: May 15, 1956                      MRN: [id number]    Pharmacy Transitions of Care Visit  HPI: Clayton Jones is a 60 y.o. male discharged on 06/16/16 with primary diagnosis of NSTEMI secondary to hypotension/GI Bleed. Pt was relatively healthy prior to this event. Patient presents to clinic for pharmacy transitions of care medication reconciliation after hospital discharge.   EF 55-60% LDL 35, TC 85, HDL 35, TG 82  All medications have been reviewed with patient.  Issues/concerns noted are as follows: No P2Y12 inhibitor due to GI bleeding.  He has completed antibiotic course as well as potassium    Past Medical History:  Diagnosis Date  . HTN (hypertension)   . NSTEMI (non-ST elevated myocardial infarction) ([place])   . Skin cancer    Basal and Squamous    Current Outpatient Prescriptions on File Prior to Visit  Medication Sig Dispense Refill  . aspirin EC 81 MG EC tablet Take 1 tablet (81 mg total) by mouth daily. 30 tablet 0  . atorvastatin (LIPITOR) 80 MG tablet Take 1 tablet (80 mg total) by mouth daily at 6 PM. 30 tablet 0  . ferrous sulfate 325 (65 FE) MG tablet Take 1 tablet (325 mg total) by mouth 2 (two) times daily with a meal. 60 tablet 3  . levofloxacin (LEVAQUIN) 750 MG tablet Take 1 tablet (750 mg total) by mouth daily. 5 tablet 0  . metoprolol tartrate (LOPRESSOR) 25 MG tablet Take 0.5 tablets (12.5 mg total) by mouth 2 (two) times daily. 60 tablet 0  . Multiple Vitamins-Minerals (MULTIVITAMIN PO) Take 1 tablet by mouth daily.    . Omega-3 Fatty Acids (FISH OIL PO) Take 1 capsule by mouth daily.    . polyethylene glycol (MIRALAX / GLYCOLAX) packet Take 17 g by mouth daily. 14 each 0  . potassium chloride (KLOR-CON) 20 MEQ packet Take 40 mEq by mouth daily. 10 packet 0   No current facility-administered medications on file prior to visit.     No Known Allergies  Assessment/Plan:  1. Patient not on P2Y12i due to GI bleed.  He has follow up scheduled with GI for next week. 2. Continue other medications as prescribed - refills needed on atorvastatin, metoprolol and potassium.   Thank you, Lelan Pons. Patterson Hammersmith, [facility]  07/02/2016 7:31 AM

## 2016-07-02 NOTE — Patient Instructions (Addendum)
Medications:  STOP Atorvastatin and Potassium   Labwork:  Have your potassium checked next week.   Follow-Up:  Your physician wants you to follow-up in: 1 year with Dr. Gwenlyn Found. You will receive a reminder letter in the mail two months in advance. If you don't receive a letter, please call our office to schedule the follow-up appointment.  If you need a refill on your cardiac medications before your next appointment, please call your pharmacy.

## 2016-07-02 NOTE — Progress Notes (Signed)
Cardiology Office Note    Date:  07/02/2016   ID:  Clayton Jones, DOB [birth year], MRN [id number]  PCP:  Teressa Lower, MD  Cardiologist:  Dr. Gwenlyn Found  Chief Complaint  Patient presents with  . Follow-up    Pt states no Sx or concerns.     History of Present Illness:    Clayton Jones is a 60 y.o. male with past medical history of CAD and HTN who presents to the office today for hospital follow-up.   He was recently admitted to the hospital from 8/31 to 06/16/2016 for an acute GI bleed and had a subsequent NSTEMI. He had underwent a colonoscopy the previous week and had multiple hyperplastic polyps removed. He experienced painless rectal bleeding a week following the procedure and underwent a repeat colonoscopy but experienced episodes of chest discomfort before and after the procedure. Troponin found to be elevated to 1.61 and Hgb was 9.0 (required 2 units of pRBCs). He was then transferred from [facility] to [facility] for further evaluation.   Once Hgb stabilized, he underwent cardiac catheterization on 8/31 which showed ostial occlusion of OM1 branch with faint left to left collaterals present. 20% mid-LAD stenosis along with 40% 1st Diag stenosis were noted. Was not started on DAPT at that time with his recent bleeding. Echo showed preserved EF of 55-60% with no WMA. He denied any repeat episodes of chest pain throughout admission. A1c was 5.0 and Lipid Panel showed total cholesterol of 86 and LDL of 35. He was discharged on ASA 81 mg daily, Atorvastatin 80mg  daily, Lopressor 12.5mg  BID, and potassium supplementation. Hgb was 8.7 at time of discharge and Dr. Gwenlyn Found recommended not starting Plavix at that time.   Today, he reports doing well. Has been playing golf multiple times per week and walking the 18-hole course without any chest discomfort or dyspnea with exertion. Denies any recurrence of rectal bleeding. Hgb was checked by his PCP last week and stable at 9.4.  He reports good  compliance with his medications. Says he was only instructed to take Potassium for 5 days following discharge and has completed this course.    Past Medical History:  Diagnosis Date  . HTN (hypertension)   . NSTEMI (non-ST elevated myocardial infarction) ([place])   . Skin cancer    Basal and Squamous    Past Surgical History:  Procedure Laterality Date  . CARDIAC CATHETERIZATION  06/13/2016  . CARDIAC CATHETERIZATION N/A 06/13/2016   Procedure: Left Heart Cath and Coronary Angiography;  Surgeon: Nelva Bush, MD;  Location: [facility];  Service: Cardiovascular;  Laterality: N/A;  . COLONOSCOPY  06/12/2016  . SKIN CANCER EXCISION     6 different procedures, all minor    Current Medications: Outpatient Medications Prior to Visit  Medication Sig Dispense Refill  . aspirin EC 81 MG EC tablet Take 1 tablet (81 mg total) by mouth daily. 30 tablet 0  . ferrous sulfate 325 (65 FE) MG tablet Take 1 tablet (325 mg total) by mouth 2 (two) times daily with a meal. 60 tablet 3  . Multiple Vitamins-Minerals (MULTIVITAMIN PO) Take 1 tablet by mouth daily.    . Omega-3 Fatty Acids (FISH OIL PO) Take 1 capsule by mouth daily.    . polyethylene glycol (MIRALAX / GLYCOLAX) packet Take 17 g by mouth daily. 14 each 0  . atorvastatin (LIPITOR) 80 MG tablet Take 1 tablet (80 mg total) by mouth daily at 6 PM. 30 tablet 0  . metoprolol tartrate (LOPRESSOR)  25 MG tablet Take 0.5 tablets (12.5 mg total) by mouth 2 (two) times daily. 60 tablet 0  . potassium chloride (KLOR-CON) 20 MEQ packet Take 40 mEq by mouth daily. 10 packet 0   No facility-administered medications prior to visit.      Allergies:   Review of patient's allergies indicates no known allergies.   Social History   Social History  . Marital status: Single    Spouse name: N/A  . Number of children: N/A  . Years of education: N/A   Occupational History  . 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 Smoking status: Former Smoker    Packs/day: 0.50    Types: Cigarettes    Quit date: 05/14/2006  . Smokeless tobacco: Current User    Types: Snuff  . Alcohol use 2.4 oz/week    4 Cans of beer per week     Comment: 3-4 beers after golf on the weekend  . Drug use: Unknown  . Sexual activity: Not Currently   Other Topics Concern  . None   Social History Narrative   Pt lives alone in [place].     Family History:  The patient's family history includes Diabetes in his father; Emphysema in his father; Hypertension in his mother.   Review of Systems:   Please see the history of present illness.    Review of Systems  Constitution: Negative for chills, fever and weakness.  Cardiovascular: Negative for chest pain, dyspnea on exertion, near-syncope, orthopnea, palpitations and syncope.  Respiratory: Negative for shortness of breath, sputum production and wheezing.   Skin: Negative for color change and poor wound healing.  Gastrointestinal: Negative for abdominal pain, constipation, diarrhea, hematochezia, melena, nausea and vomiting.  Genitourinary: Negative for dysuria and hematuria.  Psychiatric/Behavioral: Negative for altered mental status. The patient is not nervous/anxious.    All other systems reviewed and are negative.   Physical Exam:    VS:  BP 128/82   Pulse 64   Ht 5\' 8"  (1.727 m)   Wt 193 lb (87.5 kg)   BMI 29.35 kg/m    General: Well developed, well nourished,male appearing in no acute distress. Head: Normocephalic, atraumatic, sclera non-icteric, no xanthomas, nares are without discharge.  Neck: No carotid bruits. JVD not elevated.  Lungs: Respirations regular and unlabored, without wheezes or rales.  Heart: Regular rate and rhythm. No S3 or S4.  No murmur, no rubs, or gallops appreciated. Abdomen: Soft, non-tender, non-distended with normoactive bowel sounds. No hepatomegaly. No rebound/guarding. No obvious abdominal masses. Msk:  Strength and tone appear normal  for age. No joint deformities or effusions. Extremities: No clubbing or cyanosis. No edema.  Distal pedal pulses are 2+ bilaterally. Neuro: Alert and oriented X 3. Moves all extremities spontaneously. No focal deficits noted. Psych:  Responds to questions appropriately with a normal affect. Skin: No rashes or lesions noted  Wt Readings from Last 3 Encounters:  07/02/16 193 lb (87.5 kg)  06/16/16 198 lb 3.2 oz (89.9 kg)     Studies/Labs Reviewed:   EKG:  EKG is not ordered today.   Recent Labs: 06/13/2016: ALT 31 06/16/2016: BUN 7; Creatinine, Ser 0.87; Hemoglobin 8.7; Platelets 174; Potassium 3.3; Sodium 138   Lipid Panel    Component Value Date/Time   CHOL 86 06/14/2016 0543   TRIG 82 06/14/2016 0543   HDL 35 (L) 06/14/2016 0543   CHOLHDL 2.5 06/14/2016 0543   VLDL 16 06/14/2016 0543   LDLCALC  35 06/14/2016 0543    Additional studies/ records that were reviewed today include:   Echocardiogram: 06/13/2016 Study Conclusions  - Left ventricle: The cavity size was normal. There was mild focal   basal hypertrophy of the septum. Systolic function was normal.   The estimated ejection fraction was in the range of 55% to 60%.   Wall motion was normal; there were no regional wall motion   abnormalities. Doppler parameters are consistent with abnormal   left ventricular relaxation (grade 1 diastolic dysfunction).   There was no evidence of elevated ventricular filling pressure by   Doppler parameters. - Aortic valve: Trileaflet; normal thickness leaflets. There was no   regurgitation. - Aortic root: The aortic root was normal in size. - Mitral valve: Structurally normal valve. There was mild   regurgitation. - Left atrium: The atrium was normal in size. - Right ventricle: The cavity size was normal. Wall thickness was   normal. Systolic function was normal. - Tricuspid valve: There was mild regurgitation. - Pulmonary arteries: Systolic pressure was mildly increased. PA   peak  pressure: 36 mm Hg (S). - Inferior vena cava: The vessel was normal in size. - Pericardium, extracardiac: There was no pericardial effusion.  Cardiac Catheterization: 06/13/2016 1.  NSTEMI with ostial occlusion of OM1 branch. 2.  Otherwise, mild to moderate non-obstructive coronary artery disease. 3.  Upper normal to mildly elevated left ventricular filling pressure.  Plan: 1.  Manage NSTEMI medically, given that event occurred over 24 hours ago, the patient is currently chest pain-free, and his outside hospital course was complicated by significant acute blood loss from lower GI bleed. 2.  Would recommend continuation of heparin infusion for a total of 48 hours for medical management of NSTEMI with close observation for recurrent bleeding. 3.  Would benefit from dual antiplatelet therapy for up to 12 months following acute MI.  Given recent bleeding, will continue on aspirin alone, though addition of clopidogrel shortly before discharge or as an outpatient should be considered if the patient has stable hemoglobin without further hematochezia. 4.  Continue aggressive secondary prevention, including metoprolol and atorvastatin.  Assessment:    1. Non-ST elevation myocardial infarction (NSTEMI), subsequent episode of care ([place])   2. Essential hypertension   3. History of GI bleed   4. Hypokalemia      Plan:   In order of problems listed above:  1. Subsequent Episode of Care for NSTEMI - admitted from 8/31 to 06/16/2016 for an acute GI bleed and had a subsequent NSTEMI. Cath showed ostial occlusion of OM1 branch with faint left to left collaterals present, 20% mid-LAD stenosis, and 40% 1st Diag stenosis. Not started on DAPT with recent bleeding, instead on 81mg  ASA only.  - denies any recent anginal symptoms. Has been ambulating when playing golf without symptoms.  - with recent Lipid Panel showing LDL of 35, will discontinue Atorvastatin at this time. Recommend rechecking Lipid Panel in 6  months. No Plavix with continual anemia and patient is reluctant to being on more medications at this time.  - continue ASA and BB.   2. Essential HTN - well-controlled at 128/82 today. Does not check BP regularly at home.  - continue BB. Patient reports taking Lopressor 25mg  BID instead of 12.5mg  BID. Was encouraged to take 12.5mg  BID as originally instructed. If SBP sustains > 140, then go back to 25mg  BID.  3. History of GI Bleed - admitted with GI bleed during 05/2016 following colonoscopy with multiple polyps removed. Received  2 units pRBC's during admission.  - Hgb at time of discharge was 8.7, at 9.4 last week when checked by PCP. Repeat CBC next week.  - denies any evidence of active bleeding.   4. Hypokalemia - K+ 3.3 at recent hospital discharge. Has follow-up BMET with GI next week and requested to have stick at that time. Listed in his AVS that K+ needs to be checked with these labs.    Medication Adjustments/Labs and Tests Ordered: Current medicines are reviewed at length with the patient today.  Concerns regarding medicines are outlined above.  Medication changes, Labs and Tests ordered today are listed in the Patient Instructions below. Patient Instructions  Medications:  STOP Atorvastatin and Potassium   Labwork:  Have your potassium checked next week.   Follow-Up:  Your physician wants you to follow-up in: 1 year with Dr. Gwenlyn Found. You will receive a reminder letter in the mail two months in advance. If you don't receive a letter, please call our office to schedule the follow-up appointment.  If you need a refill on your cardiac medications before your next appointment, please call your pharmacy.     Arna Medici, [place]  07/02/2016 3:38 PM    [facility] [place], [place], [place]  [address] Phone: [phone]; Fax: [phone]  [address], [place],  [address] Phone: [phone]

## 2016-07-03 ENCOUNTER — Encounter: Payer: Self-pay | Admitting: Pharmacist

## 2016-07-09 DIAGNOSIS — I252 Old myocardial infarction: Secondary | ICD-10-CM | POA: Diagnosis not present

## 2016-07-09 DIAGNOSIS — K91840 Postprocedural hemorrhage and hematoma of a digestive system organ or structure following a digestive system procedure: Secondary | ICD-10-CM | POA: Diagnosis not present

## 2016-07-11 DIAGNOSIS — K91840 Postprocedural hemorrhage and hematoma of a digestive system organ or structure following a digestive system procedure: Secondary | ICD-10-CM | POA: Diagnosis not present

## 2016-07-17 DIAGNOSIS — Z23 Encounter for immunization: Secondary | ICD-10-CM | POA: Diagnosis not present

## 2016-10-21 DIAGNOSIS — H1045 Other chronic allergic conjunctivitis: Secondary | ICD-10-CM | POA: Diagnosis not present

## 2017-02-10 DIAGNOSIS — C44629 Squamous cell carcinoma of skin of left upper limb, including shoulder: Secondary | ICD-10-CM | POA: Diagnosis not present

## 2017-06-26 DIAGNOSIS — D485 Neoplasm of uncertain behavior of skin: Secondary | ICD-10-CM | POA: Diagnosis not present

## 2017-06-26 DIAGNOSIS — C44222 Squamous cell carcinoma of skin of right ear and external auricular canal: Secondary | ICD-10-CM | POA: Diagnosis not present

## 2017-06-26 DIAGNOSIS — L570 Actinic keratosis: Secondary | ICD-10-CM | POA: Diagnosis not present

## 2017-07-03 DIAGNOSIS — H353131 Nonexudative age-related macular degeneration, bilateral, early dry stage: Secondary | ICD-10-CM | POA: Diagnosis not present

## 2017-07-03 DIAGNOSIS — H524 Presbyopia: Secondary | ICD-10-CM | POA: Diagnosis not present

## 2017-07-11 ENCOUNTER — Ambulatory Visit (INDEPENDENT_AMBULATORY_CARE_PROVIDER_SITE_OTHER): Payer: BLUE CROSS/BLUE SHIELD | Admitting: Cardiovascular Disease

## 2017-07-11 ENCOUNTER — Encounter: Payer: Self-pay | Admitting: Cardiovascular Disease

## 2017-07-11 VITALS — BP 162/100 | HR 78 | Ht 70.0 in | Wt 206.0 lb

## 2017-07-11 DIAGNOSIS — I10 Essential (primary) hypertension: Secondary | ICD-10-CM

## 2017-07-11 DIAGNOSIS — I214 Non-ST elevation (NSTEMI) myocardial infarction: Secondary | ICD-10-CM

## 2017-07-11 MED ORDER — LISINOPRIL 5 MG PO TABS: 5.0000 mg | ORAL_TABLET | Freq: Every day | ORAL | 1 refills | Status: DC

## 2017-07-11 NOTE — Assessment & Plan Note (Addendum)
history of CAD status post cardiac catheterization in the setting of a non-STEMI performed by [name] on 06/13/16 revealing an occluded small first marginal branch with otherwise scattered noncritical CAD and normal LV function. Medical therapy was recommended. He remains on baby aspirin and lipomatous beta blocker.

## 2017-07-11 NOTE — Progress Notes (Signed)
07/11/2017 Clayton Jones   09-Sep-1956  [id number]  Primary Physician Clayton Hatchet Jaymes Graff, MD Primary Cardiologist: Clayton Harp MD Clayton Jones, [place]  HPI:  Clayton Jones is a 61 y.o. male single with no children who is Asst. 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 I'm seeing him back for his one-year follow-up after having been hospitalized a year ago for a non-STEMI. He has a history of treated hypertension. He had a non-STEMI in the setting of colonoscopy, and GI bleed. Dr. Gerald Stabs Jones performed his catheterization on 06/13/16 revealing an occluded small first marginal branch with normal LV function. Medical therapy was recommended. He's had no recurrent chest pain and is fairly active. He plays golf several times a week.   Current Meds  Medication Sig  . aspirin EC 81 MG EC tablet Take 1 tablet (81 mg total) by mouth daily.  . metoprolol tartrate (LOPRESSOR) 25 MG tablet Take 0.5 tablets (12.5 mg total) by mouth 2 (two) times daily.  . Multiple Vitamins-Minerals (MULTIVITAMIN PO) Take 1 tablet by mouth daily.  . Omega-3 Fatty Acids (FISH OIL PO) Take 1 capsule by mouth daily.     No Known Allergies  Social History   Social History  . Marital status: Single    Spouse name: N/A  . Number of children: N/A  . Years of education: N/A   Occupational History  . 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 Smoking status: Former Smoker    Packs/day: 0.50    Types: Cigarettes    Quit date: 05/14/2006  . Smokeless tobacco: Current User    Types: Snuff  . Alcohol use 2.4 oz/week    4 Cans of beer per week     Comment: 3-4 beers after golf on the weekend  . Drug use: Unknown  . Sexual activity: Not Currently   Other Topics Concern  . Not on file   Social History Narrative   Pt lives alone in [place].     Review of Systems: General: negative for chills, fever, night sweats or weight changes.  Cardiovascular: negative for chest  pain, dyspnea on exertion, edema, orthopnea, palpitations, paroxysmal nocturnal dyspnea or shortness of breath Dermatological: negative for rash Respiratory: negative for cough or wheezing Urologic: negative for hematuria Abdominal: negative for nausea, vomiting, diarrhea, bright red blood per rectum, melena, or hematemesis Neurologic: negative for visual changes, syncope, or dizziness All other systems reviewed and are otherwise negative except as noted above.    Blood pressure (!) 162/100, pulse 78, height 5\' 10"  (1.778 m), weight 206 lb (93.4 kg).  General appearance: alert and no distress Neck: no adenopathy, no carotid bruit, no JVD, supple, symmetrical, trachea midline and thyroid not enlarged, symmetric, no tenderness/mass/nodules Lungs: clear to auscultation bilaterally Heart: regular rate and rhythm, S1, S2 normal, no murmur, click, rub or gallop Extremities: extremities normal, atraumatic, no cyanosis or edema Pulses: 2+ and symmetric Skin: Skin color, texture, turgor normal. No rashes or lesions Neurologic: Alert and oriented X 3, normal strength and tone. Normal symmetric reflexes. Normal coordination and gait  EKG sinus rhythm at 78 with early R-wave transition. I personally reviewed this EKG.  ASSESSMENT AND PLAN:   Essential hypertension History of essential essential hypertension blood pressure measurements today 162/100. He does check his blood pressure to the runs in the 140-160 range over 90-100. He is worse on restriction. I'm going to begin him on lisinopril 5 mg  a day and will check a basic metabolic panel in 3 weeks. He instructed him to keep a blood pressure log which she will review with Clayton Jones in one month.  Non-ST elevation myocardial infarction (NSTEMI), subsequent episode of care [facility])  history of CAD status post cardiac catheterization in the setting of a non-STEMI performed by Dr. Gerald Stabs Jones on 06/13/16 revealing an occluded small first marginal branch with  otherwise scattered noncritical CAD and normal LV function. Medical therapy was recommended. He remains on baby aspirin and lipomatous beta blocker.       Clayton Harp MD FACP,FACC,FAHA, [facility] 07/11/2017 3:34 PM

## 2017-07-11 NOTE — Assessment & Plan Note (Addendum)
History of essential essential hypertension blood pressure measurements today 162/100. He does check his blood pressure to the runs in the 140-160 range over 90-100. He is worse on restriction. I'm going to begin him on lisinopril 5 mg a day and will check a basic metabolic panel in 3 weeks. He instructed him to keep a blood pressure log which she will review with [name] in one month.

## 2017-07-11 NOTE — Patient Instructions (Signed)
Medication Instructions: START Lisinopril 5 mg daily.   Labwork: Your physician recommends that you return for lab work in the next few days. Then return in 3 weeks to repeat BMET.   Follow-Up: Your physician recommends that you schedule a follow-up appointment in: 1 month with PharmD for blood pressure management.  Your physician wants you to follow-up in: 1 year with Dr. Gwenlyn Found. You will receive a reminder letter in the mail two months in advance. If you don't receive a letter, please call our office to schedule the follow-up appointment.  If you need a refill on your cardiac medications before your next appointment, please call your pharmacy.

## 2017-07-14 DIAGNOSIS — I10 Essential (primary) hypertension: Secondary | ICD-10-CM | POA: Diagnosis not present

## 2017-07-14 DIAGNOSIS — I214 Non-ST elevation (NSTEMI) myocardial infarction: Secondary | ICD-10-CM | POA: Diagnosis not present

## 2017-07-15 LAB — CBC
Hematocrit: 46.2 % (ref 37.5–51.0)
Hemoglobin: 15.9 g/dL (ref 13.0–17.7)
MCH: 32.6 pg (ref 26.6–33.0)
MCHC: 34.4 g/dL (ref 31.5–35.7)
MCV: 95 fL (ref 79–97)
PLATELETS: 218 10*3/uL (ref 150–379)
RBC: 4.87 x10E6/uL (ref 4.14–5.80)
RDW: 13.4 % (ref 12.3–15.4)
WBC: 5.6 10*3/uL (ref 3.4–10.8)

## 2017-07-15 LAB — BASIC METABOLIC PANEL
BUN / CREAT RATIO: 11 (ref 10–24)
BUN: 11 mg/dL (ref 8–27)
CO2: 34 mmol/L — AB (ref 20–29)
Calcium: 9.4 mg/dL (ref 8.6–10.2)
Chloride: 101 mmol/L (ref 96–106)
Creatinine, Ser: 0.96 mg/dL (ref 0.76–1.27)
GFR calc Af Amer: 98 mL/min/{1.73_m2} (ref >59)
GFR, EST NON AFRICAN AMERICAN: 84 mL/min/{1.73_m2} (ref >59)
GLUCOSE: 87 mg/dL (ref 65–99)
POTASSIUM: 4 mmol/L (ref 3.5–5.2)
SODIUM: 140 mmol/L (ref 134–144)

## 2017-07-15 LAB — HEPATIC FUNCTION PANEL
ALBUMIN: 4.8 g/dL (ref 3.6–4.8)
ALT: 21 IU/L (ref 0–44)
AST: 22 IU/L (ref 0–40)
Alkaline Phosphatase: 106 IU/L (ref 39–117)
BILIRUBIN TOTAL: 1 mg/dL (ref 0.0–1.2)
BILIRUBIN, DIRECT: 0.21 mg/dL (ref 0.00–0.40)
Total Protein: 7.3 g/dL (ref 6.0–8.5)

## 2017-07-15 LAB — HEMOGLOBIN A1C
Est. average glucose Bld gHb Est-mCnc: 103 mg/dL
HEMOGLOBIN A1C: 5.2 % (ref 4.8–5.6)

## 2017-07-15 LAB — TSH: TSH: 1.47 u[IU]/mL (ref 0.450–4.500)

## 2017-07-15 LAB — LIPID PANEL
CHOL/HDL RATIO: 4 ratio (ref 0.0–5.0)
Cholesterol, Total: 207 mg/dL — ABNORMAL HIGH (ref 100–199)
HDL: 52 mg/dL (ref >39)
LDL CALC: 107 mg/dL — AB (ref 0–99)
Triglycerides: 242 mg/dL — ABNORMAL HIGH (ref 0–149)
VLDL Cholesterol Cal: 48 mg/dL — ABNORMAL HIGH (ref 5–40)

## 2017-07-15 LAB — T4, FREE: Free T4: 1.4 ng/dL (ref 0.82–1.77)

## 2017-07-30 ENCOUNTER — Other Ambulatory Visit: Payer: Self-pay | Admitting: Cardiovascular Disease

## 2017-07-30 DIAGNOSIS — I214 Non-ST elevation (NSTEMI) myocardial infarction: Secondary | ICD-10-CM | POA: Diagnosis not present

## 2017-07-30 DIAGNOSIS — I10 Essential (primary) hypertension: Secondary | ICD-10-CM | POA: Diagnosis not present

## 2017-07-30 LAB — BASIC METABOLIC PANEL
BUN / CREAT RATIO: 16 (ref 10–24)
BUN: 15 mg/dL (ref 8–27)
CALCIUM: 9.4 mg/dL (ref 8.6–10.2)
CO2: 22 mmol/L (ref 20–29)
Chloride: 101 mmol/L (ref 96–106)
Creatinine, Ser: 0.95 mg/dL (ref 0.76–1.27)
GFR calc non Af Amer: 85 mL/min/{1.73_m2} (ref >59)
GFR, EST AFRICAN AMERICAN: 99 mL/min/{1.73_m2} (ref >59)
GLUCOSE: 95 mg/dL (ref 65–99)
POTASSIUM: 3.9 mmol/L (ref 3.5–5.2)
Sodium: 137 mmol/L (ref 134–144)

## 2017-08-04 ENCOUNTER — Ambulatory Visit (INDEPENDENT_AMBULATORY_CARE_PROVIDER_SITE_OTHER): Payer: BLUE CROSS/BLUE SHIELD | Admitting: Pharmacist

## 2017-08-04 VITALS — BP 138/94 | HR 68

## 2017-08-04 DIAGNOSIS — I10 Essential (primary) hypertension: Secondary | ICD-10-CM | POA: Diagnosis not present

## 2017-08-04 MED ORDER — METOPROLOL TARTRATE 25 MG PO TABS: 12.5000 mg | ORAL_TABLET | Freq: Two times a day (BID) | ORAL | 3 refills | Status: DC

## 2017-08-04 MED ORDER — LISINOPRIL 5 MG PO TABS: 5.0000 mg | ORAL_TABLET | Freq: Two times a day (BID) | ORAL | 1 refills | Status: DC

## 2017-08-04 NOTE — Patient Instructions (Addendum)
Return for a follow up appointment in 4 weeks (2 weeks by phone)  Your blood pressure today is 138/94 pulse 68  Check your blood pressure at home daily (if able) and keep record of the readings.  Take your BP meds as follows: **INCREASE lisinopril to 10mg  daily (5mg  every morning and 5mg  every evening) **Continue all other medication as previously prescribed **Repeat blood work in 2 week  Bring all of your meds, your BP cuff and your record of home blood pressures to your next appointment.  Exercise as you're able, try to walk approximately 30 minutes per day.  Keep salt intake to a minimum, especially watch canned and prepared boxed foods.  Eat more fresh fruits and vegetables and fewer canned items.  Avoid eating in fast food restaurants.    HOW TO TAKE YOUR BLOOD PRESSURE: . Rest 5 minutes before taking your blood pressure. .  Don't smoke or drink caffeinated beverages for at least 30 minutes before. . Take your blood pressure before (not after) you eat. . Sit comfortably with your back supported and both feet on the floor (don't cross your legs). . Elevate your arm to heart level on a table or a desk. . Use the proper sized cuff. It should fit smoothly and snugly around your bare upper arm. There should be enough room to slip a fingertip under the cuff. The bottom edge of the cuff should be 1 inch above the crease of the elbow. . Ideally, take 3 measurements at one sitting and record the average.

## 2017-08-04 NOTE — Progress Notes (Signed)
Patient ID: Clayton Jones                 DOB: June 17, 1956                      MRN: [id number]     HPI: Clayton Jones is a 61 y.o. male referred by Dr. Gwenlyn Jones to HTN clinic. PMH includes non-ST elevated MI, hypertension, and GI bleed. Lisinopril 5mg  daily was added on 07/11/2017 by Dr Clayton Jones to improve BP management. Repeat BMET from 07/30/2017 show stable renal function and electrolytes.   Patient presents for HTN clinic for evaluation. Denies dizziness, swelling, head aches or chest pain. No ADR to therapy reported and no new complains.  Current HTN meds:  Lisinopril 5mg  daily Metoprolol tartrate 12.5mg  twice daily  Previously tried:   BP goal: 130/80  Family History: family history includes Diabetes in his father; Emphysema in his father; Hypertension in his mother.   Social History: former smoker, currently user of smokelss tobacco, occasional alcohol intake  Diet: low sodium mostly; occasional frozen meals but try to avoid them  Exercise: golf few time per week year round  Home BP readings:  21 readings; average 144/89; pulse 62-108 bpm)  Wt Readings from Last 3 Encounters:  07/11/17 206 lb (93.4 kg)  07/02/16 193 lb (87.5 kg)  06/16/16 198 lb 3.2 oz (89.9 kg)   BP Readings from Last 3 Encounters:  08/04/17 (!) 138/94  07/11/17 (!) 162/100  07/02/16 128/82   Pulse Readings from Last 3 Encounters:  08/04/17 68  07/11/17 78  07/02/16 64    Past Medical History:  Diagnosis Date  . CAD (coronary artery disease)    a. 05/2016: NSTEMI in setting of GIB. Cath showing total occlusion of OM1 with collaterals present, 20% mid-LAD stenosis, and 40% 1st Diag stenosis. No intervention indicated.   Marland Kitchen HTN (hypertension)   . Skin cancer    Basal and Squamous    Current Outpatient Prescriptions on File Prior to Visit  Medication Sig Dispense Refill  . aspirin EC 81 MG EC tablet Take 1 tablet (81 mg total) by mouth daily. 30 tablet 0  . Multiple Vitamins-Minerals (MULTIVITAMIN  PO) Take 1 tablet by mouth daily.    . Omega-3 Fatty Acids (FISH OIL PO) Take 1 capsule by mouth daily.     No current facility-administered medications on file prior to visit.     No Known Allergies  Blood pressure (!) 138/94, pulse 68, SpO2 98 %.  Essential hypertension Blood pressure today of 138/94 remains slightly above desired goal of <130/80. Home BP readings also above goal with an average reading of 144/89. Patient denies adverse reactions or problems with current therapy and renal function remains stable 3 weeks after initiating Lisinopril. Will increase lisinopril dose to 10mg  daily, continue metoprolol 12.5mg  twice daily and repeat BMET in 2 weeks. Patient will continue to monitor BP daily as home and bring records to f/u in 4 weeks.   Clayton Jones PharmD, BCPS, [place] [address] 08/05/2017 7:52 AM

## 2017-08-05 NOTE — Assessment & Plan Note (Signed)
Blood pressure today of 138/94 remains slightly above desired goal of <130/80. Home BP readings also above goal with an average reading of 144/89. Patient denies adverse reactions or problems with current therapy and renal function remains stable 3 weeks after initiating Lisinopril. Will increase lisinopril dose to 10mg  daily, continue metoprolol 12.5mg  twice daily and repeat BMET in 2 weeks. Patient will continue to monitor BP daily as home and bring records to f/u in 4 weeks.

## 2017-08-12 DIAGNOSIS — D485 Neoplasm of uncertain behavior of skin: Secondary | ICD-10-CM | POA: Diagnosis not present

## 2017-08-20 DIAGNOSIS — I10 Essential (primary) hypertension: Secondary | ICD-10-CM | POA: Diagnosis not present

## 2017-08-21 LAB — BASIC METABOLIC PANEL
BUN/Creatinine Ratio: 16 (ref 10–24)
BUN: 15 mg/dL (ref 8–27)
CO2: 25 mmol/L (ref 20–29)
CREATININE: 0.92 mg/dL (ref 0.76–1.27)
Calcium: 9.8 mg/dL (ref 8.6–10.2)
Chloride: 102 mmol/L (ref 96–106)
GFR calc non Af Amer: 89 mL/min/{1.73_m2} (ref >59)
GFR, EST AFRICAN AMERICAN: 103 mL/min/{1.73_m2} (ref >59)
Glucose: 95 mg/dL (ref 65–99)
Potassium: 4.1 mmol/L (ref 3.5–5.2)
SODIUM: 136 mmol/L (ref 134–144)

## 2017-08-27 DIAGNOSIS — C44222 Squamous cell carcinoma of skin of right ear and external auricular canal: Secondary | ICD-10-CM | POA: Diagnosis not present

## 2017-09-05 IMAGING — CR DG CHEST 1V PORT
1 series · 1 of 1 positions shown · non-contrast
Comparison: None.

CLINICAL DATA: Chest pain

EXAM:
PORTABLE CHEST 1 VIEW

[AP]
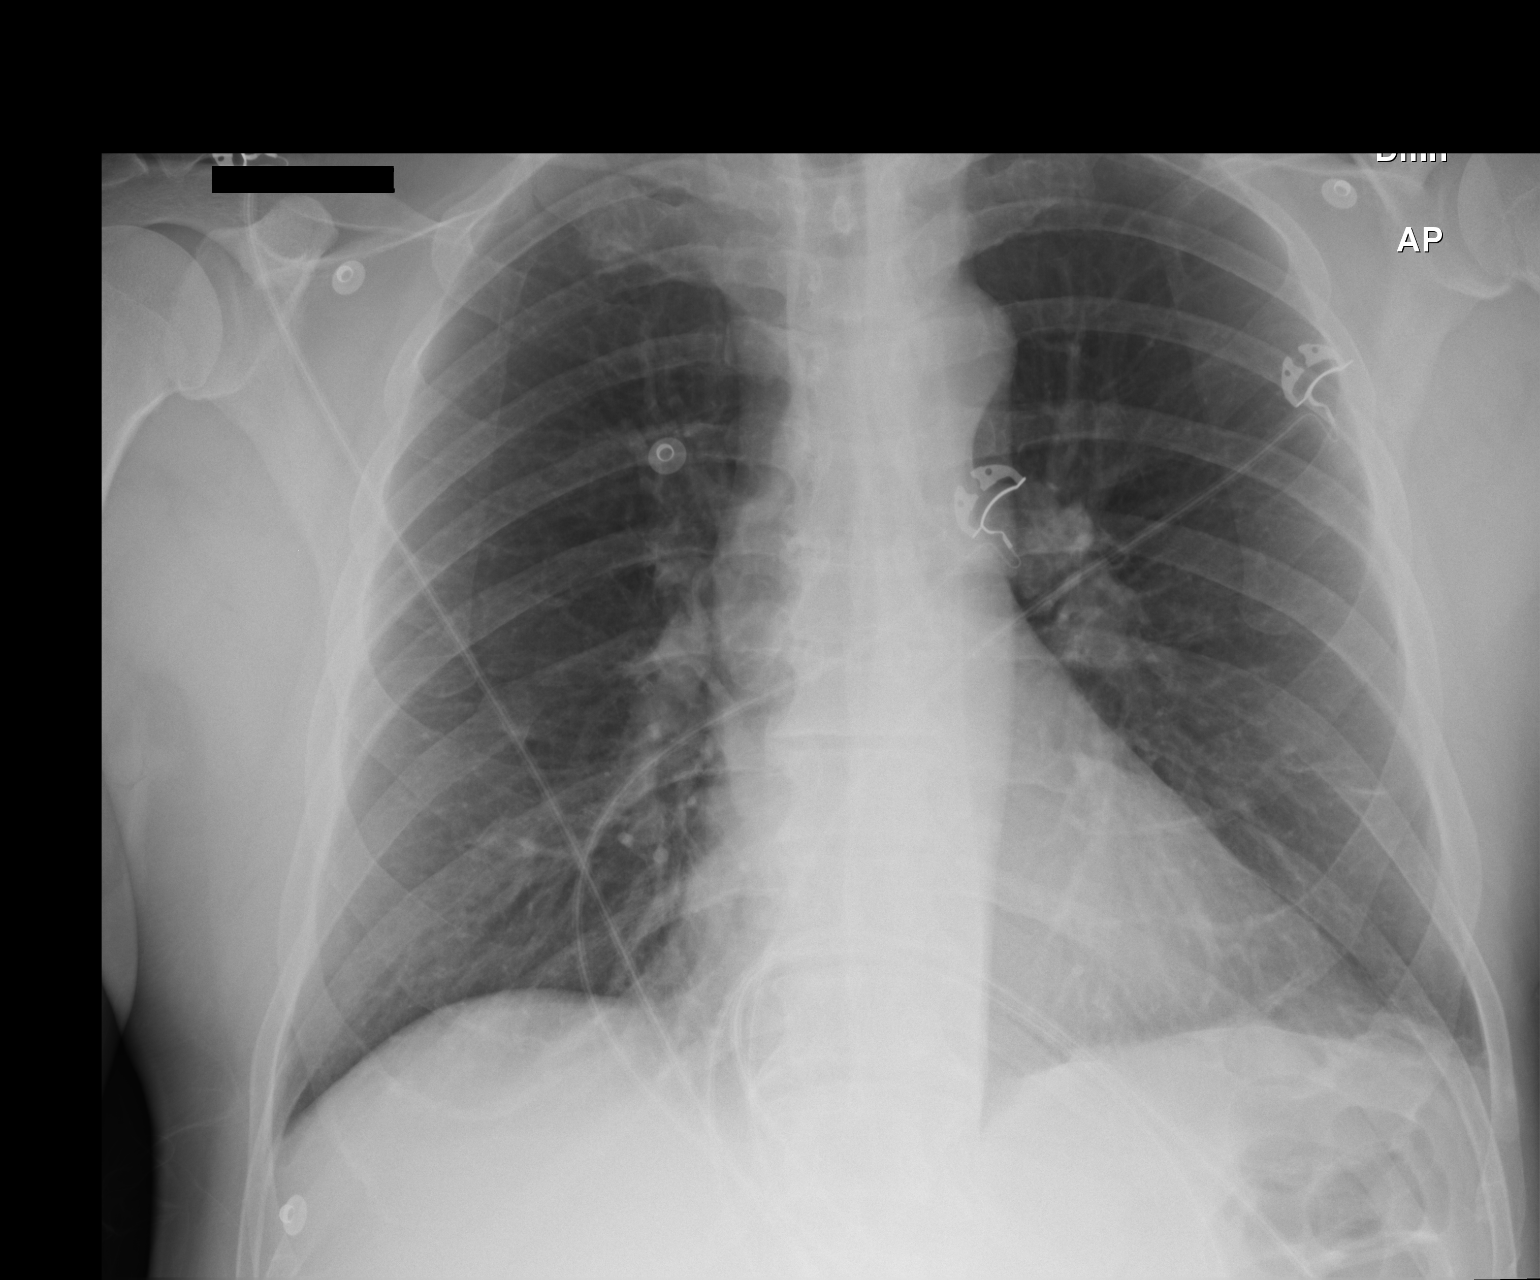

[1 of 1 positions shown; findings below may reference images not displayed]

FINDINGS: Mild linear scarring or atelectasis in the left base. The right lung
is clear. The pulmonary vasculature is normal. Hilar and mediastinal
contours are unremarkable. Heart size is normal. No large effusions.
IMPRESSION: Linear left base scarring or atelectasis.

## 2017-09-06 IMAGING — CR DG CHEST 2V
2 series · 2 of 2 positions shown · non-contrast
Comparison: June 13, 2016

CLINICAL DATA: Fever.  Hypertension.

EXAM:
CHEST  2 VIEW

[chest pa]
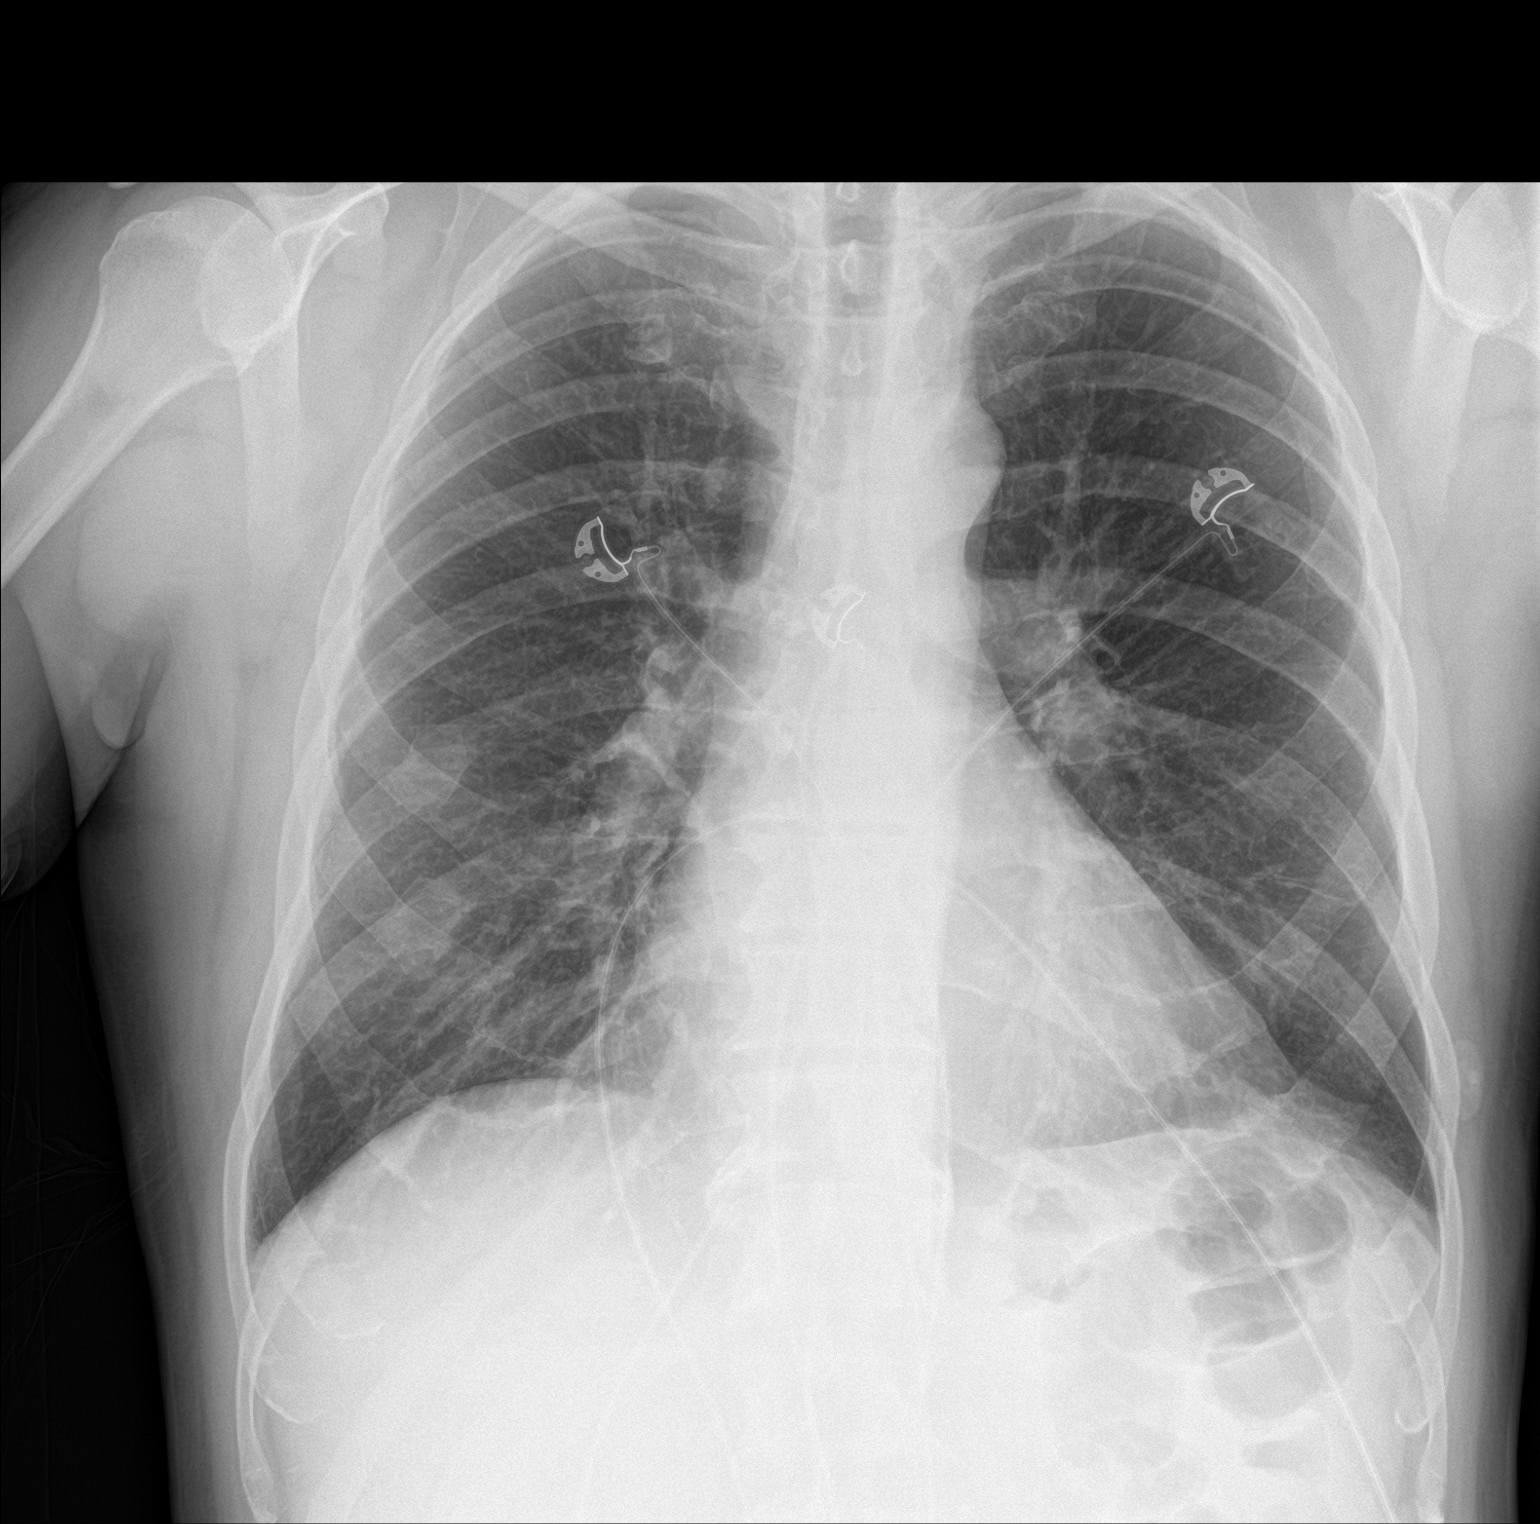

[chest lat]
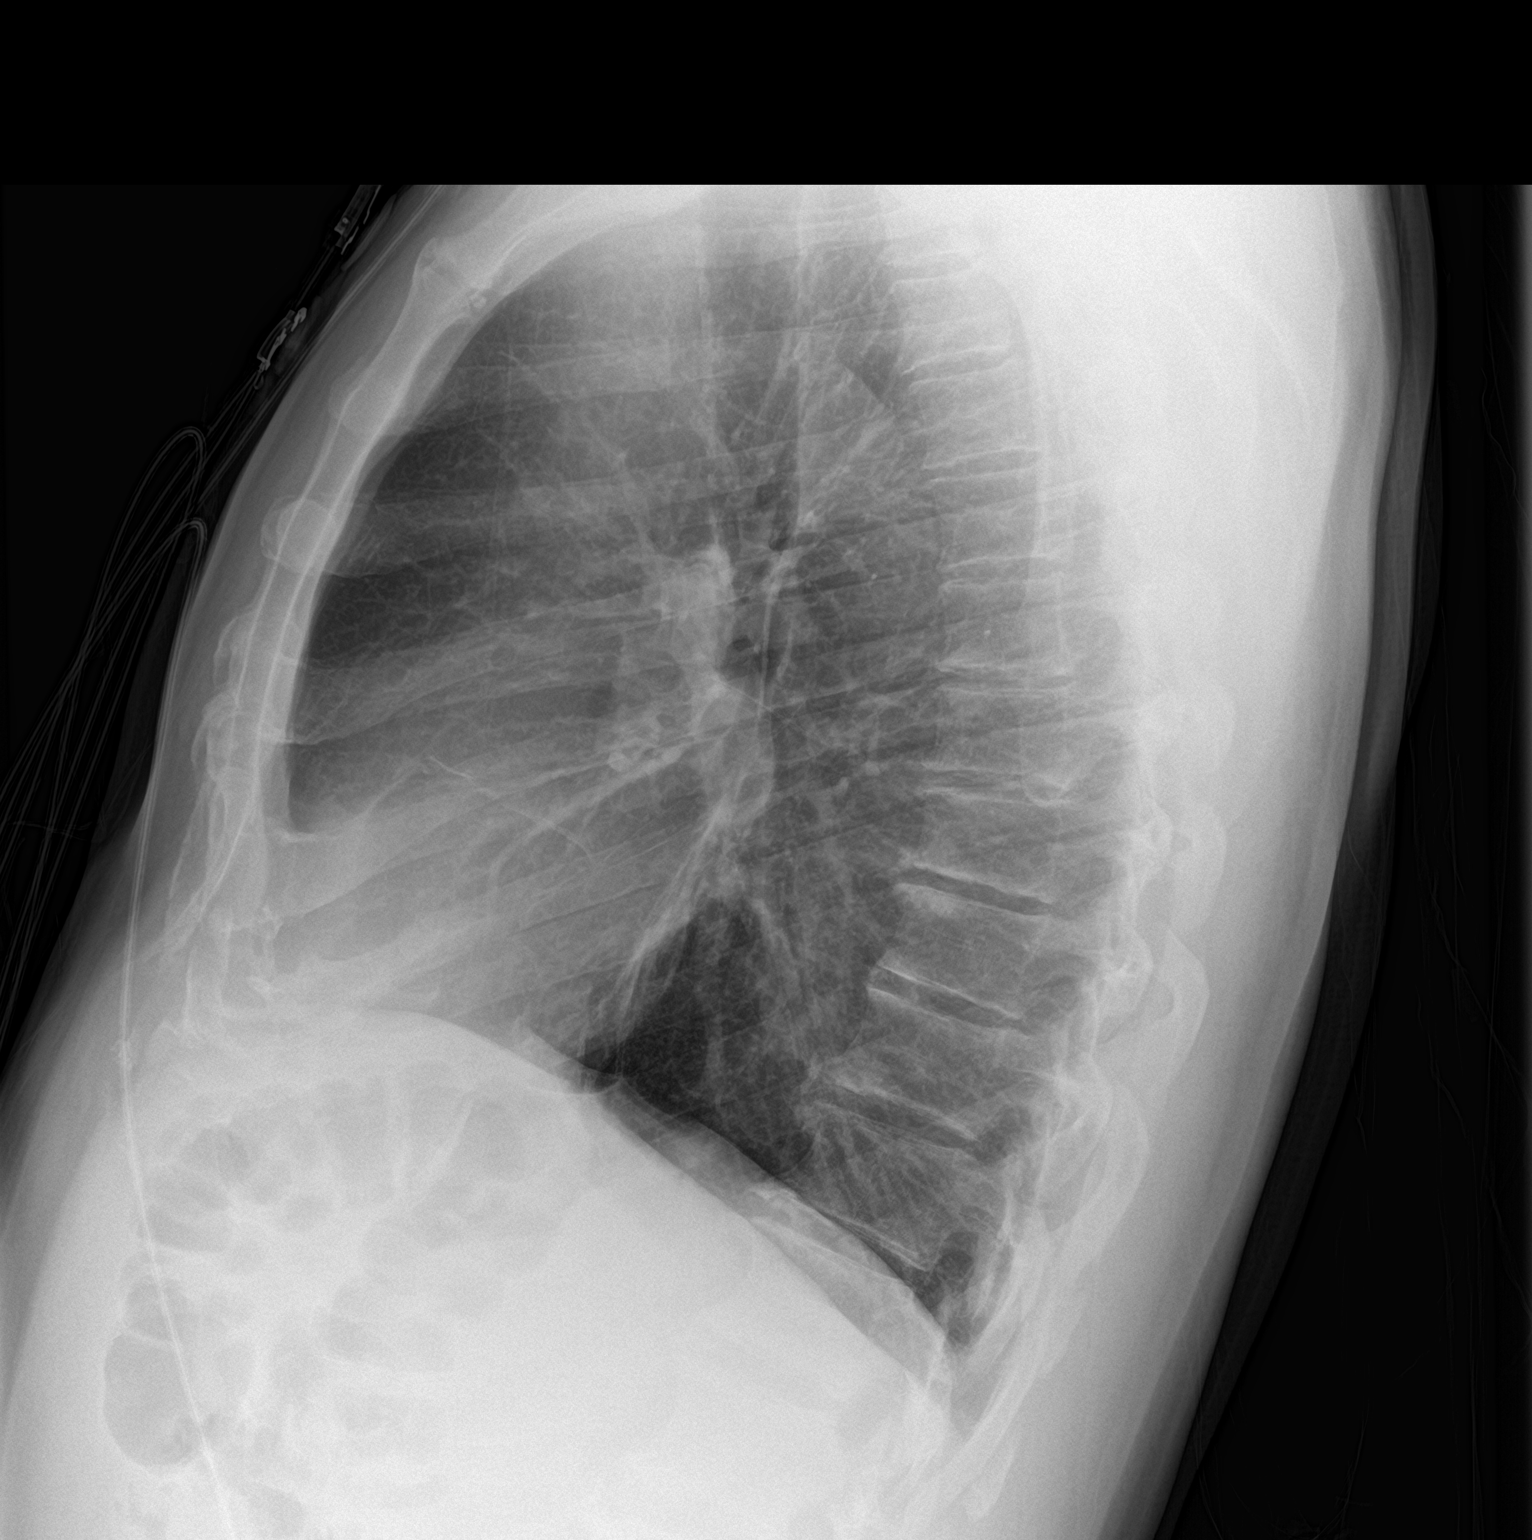

[2 of 2 positions shown; findings below may reference images not displayed]

FINDINGS: There is slight atelectasis in the left lower lobe. Lungs elsewhere
are clear. Heart size and pulmonary vascularity are normal. No
adenopathy. There are prior fractures of the posterior right eighth
and ninth ribs, healed.
IMPRESSION: Mild left lower lobe atelectatic change.  No edema or consolidation.

## 2017-10-01 DIAGNOSIS — Z1211 Encounter for screening for malignant neoplasm of colon: Secondary | ICD-10-CM | POA: Diagnosis not present

## 2017-10-01 DIAGNOSIS — Z86010 Personal history of colonic polyps: Secondary | ICD-10-CM | POA: Diagnosis not present

## 2017-10-01 DIAGNOSIS — K635 Polyp of colon: Secondary | ICD-10-CM | POA: Diagnosis not present

## 2017-10-01 DIAGNOSIS — K5730 Diverticulosis of large intestine without perforation or abscess without bleeding: Secondary | ICD-10-CM | POA: Diagnosis not present

## 2017-10-01 DIAGNOSIS — D125 Benign neoplasm of sigmoid colon: Secondary | ICD-10-CM | POA: Diagnosis not present

## 2017-12-30 DIAGNOSIS — L570 Actinic keratosis: Secondary | ICD-10-CM | POA: Diagnosis not present

## 2018-01-07 DIAGNOSIS — H353131 Nonexudative age-related macular degeneration, bilateral, early dry stage: Secondary | ICD-10-CM | POA: Diagnosis not present

## 2018-02-17 ENCOUNTER — Encounter: Payer: Self-pay | Admitting: Cardiovascular Disease

## 2018-02-20 ENCOUNTER — Other Ambulatory Visit: Payer: Self-pay | Admitting: Cardiovascular Disease

## 2018-02-20 DIAGNOSIS — I10 Essential (primary) hypertension: Secondary | ICD-10-CM

## 2018-02-20 MED ORDER — LISINOPRIL 5 MG PO TABS
5.0000 mg | ORAL_TABLET | Freq: Two times a day (BID) | ORAL | 1 refills | Status: DC
Start: 2018-02-20 — End: 2018-12-02

## 2018-05-25 DIAGNOSIS — C44729 Squamous cell carcinoma of skin of left lower limb, including hip: Secondary | ICD-10-CM | POA: Diagnosis not present

## 2018-07-07 DIAGNOSIS — H353131 Nonexudative age-related macular degeneration, bilateral, early dry stage: Secondary | ICD-10-CM | POA: Diagnosis not present

## 2018-07-07 DIAGNOSIS — C44719 Basal cell carcinoma of skin of left lower limb, including hip: Secondary | ICD-10-CM | POA: Diagnosis not present

## 2018-10-23 DIAGNOSIS — H40013 Open angle with borderline findings, low risk, bilateral: Secondary | ICD-10-CM | POA: Diagnosis not present

## 2018-11-11 ENCOUNTER — Other Ambulatory Visit: Payer: Self-pay | Admitting: Cardiovascular Disease

## 2018-11-11 NOTE — Telephone Encounter (Signed)
Patient needs an appointment for further refills.  Message sent to Scheduling

## 2018-11-12 ENCOUNTER — Other Ambulatory Visit: Payer: Self-pay | Admitting: Cardiovascular Disease

## 2018-11-12 MED ORDER — METOPROLOL TARTRATE 25 MG PO TABS: 12.5000 mg | ORAL_TABLET | Freq: Two times a day (BID) | ORAL | 0 refills | Status: DC

## 2018-11-12 NOTE — Telephone Encounter (Signed)
New Message    *STAT* If patient is at the pharmacy, call can be transferred to refill team.   1. Which medications need to be refilled? (please list name of each medication and dose if known) Metoprolol 25mg   2. Which pharmacy/location (including street and city if local pharmacy) is medication to be sent to?  [facility] in [place]  3. Do they need a 30 day or 90 day supply? 90 day supply     Patient has appt for 12/02/18 @ 9:15am but he's about to run out of his meds.

## 2018-12-02 ENCOUNTER — Encounter: Payer: Self-pay | Admitting: Cardiovascular Disease

## 2018-12-02 ENCOUNTER — Ambulatory Visit: Payer: BLUE CROSS/BLUE SHIELD | Admitting: Cardiovascular Disease

## 2018-12-02 VITALS — BP 144/96 | HR 71 | Ht 70.0 in | Wt 208.0 lb

## 2018-12-02 DIAGNOSIS — I214 Non-ST elevation (NSTEMI) myocardial infarction: Secondary | ICD-10-CM

## 2018-12-02 DIAGNOSIS — E785 Hyperlipidemia, unspecified: Secondary | ICD-10-CM | POA: Diagnosis not present

## 2018-12-02 DIAGNOSIS — I10 Essential (primary) hypertension: Secondary | ICD-10-CM | POA: Diagnosis not present

## 2018-12-02 LAB — LIPID PANEL
CHOL/HDL RATIO: 3.9 ratio (ref 0.0–5.0)
CHOLESTEROL TOTAL: 201 mg/dL — AB (ref 100–199)
HDL: 51 mg/dL (ref >39)
LDL Calculated: 109 mg/dL — ABNORMAL HIGH (ref 0–99)
TRIGLYCERIDES: 207 mg/dL — AB (ref 0–149)
VLDL Cholesterol Cal: 41 mg/dL — ABNORMAL HIGH (ref 5–40)

## 2018-12-02 LAB — HEPATIC FUNCTION PANEL
ALT: 31 IU/L (ref 0–44)
AST: 23 IU/L (ref 0–40)
Albumin: 4.8 g/dL (ref 3.8–4.8)
Alkaline Phosphatase: 111 IU/L (ref 39–117)
BILIRUBIN TOTAL: 0.8 mg/dL (ref 0.0–1.2)
BILIRUBIN, DIRECT: 0.18 mg/dL (ref 0.00–0.40)
Total Protein: 7.1 g/dL (ref 6.0–8.5)

## 2018-12-02 NOTE — Assessment & Plan Note (Signed)
History of essential hypertension her blood pressure measured today at 144/96.  His blood pressure log which he provided today showed his blood pressures have been much better controlled at home.  He is on metoprolol.  Continue current medications

## 2018-12-02 NOTE — Assessment & Plan Note (Signed)
History of non-STEMI after a colonoscopy and GI bleed with cath performed 06/13/2016 revealing occluded small marginal branch which was treated medically.  His EF was normal.  Said no recurrent symptoms.

## 2018-12-02 NOTE — Patient Instructions (Signed)
Medication Instructions:  Your physician recommends that you continue on your current medications as directed. Please refer to the Current Medication list given to you today.  If you need a refill on your cardiac medications before your next appointment, please call your pharmacy.   Lab work: Your physician recommends that you return for lab work TODAY: LIPID/LIVER PANELS  If you have labs (blood work) drawn today and your tests are completely normal, you will receive your results only by: Marland Kitchen MyChart Message (if you have MyChart) OR . A paper copy in the mail If you have any lab test that is abnormal or we need to change your treatment, we will call you to review the results.  Testing/Procedures: NONE  Follow-Up: At [facility], you and your health needs are our priority.  As part of our continuing mission to provide you with exceptional heart care, we have created designated Provider Care Teams.  These Care Teams include your primary Cardiologist (physician) and Advanced Practice Providers (APPs -  Physician Assistants and Nurse Practitioners) who all work together to provide you with the care you need, when you need it. . You will need a follow up appointment in 12 months.  Please call our office 2 months in advance to schedule this appointment.  You may see Dr. Gwenlyn Found or one of the following Advanced Practice Providers on your designated Care Team:   . Kerin Ransom, [place] . Almyra Deforest, PA-C . Fabian Sharp, PA-C . Jory Sims, DNP . Rosaria Ferries, PA-C . Roby Lofts, PA-C . Sande Rives, PA-C

## 2018-12-02 NOTE — Progress Notes (Signed)
12/02/2018 Clayton Jones   03/23/1956  [id number]  Primary Physician Garlon Hatchet Jaymes Graff, MD Primary Cardiologist: Lorretta Harp MD Lupe Carney, [place]  HPI:  Clayton Jones is a 63 y.o.  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 Scientist, physiological at [employer].  I last saw him in the office 07/03/2017.-year follow-up after having been hospitalized a year ago for a non-STEMI. He has a history of treated hypertension. He had a non-STEMI in the setting of colonoscopy, and GI bleed. [name] performed his catheterization on 06/13/16 revealing an occluded small first marginal branch with normal LV function. Medical therapy was recommended. He's had no recurrent chest pain and is fairly active. He plays golf several times a week.    Current Meds  Medication Sig  . aspirin EC 81 MG EC tablet Take 1 tablet (81 mg total) by mouth daily.  . metoprolol tartrate (LOPRESSOR) 25 MG tablet Take 0.5 tablets (12.5 mg total) by mouth 2 (two) times daily. MUST KEEP APPT FOR FUTURE REFILLS  . Multiple Vitamins-Minerals (MULTIVITAMIN PO) Take 1 tablet by mouth daily.  . Omega-3 Fatty Acids (FISH OIL PO) Take 1 capsule by mouth daily.     No Known Allergies  Social History   Socioeconomic History  . Marital status: Single    Spouse name: Not on file  . Number of children: Not on file  . Years of education: Not on file  . Highest education level: Not on file  Occupational History  . Occupation: Haematologist for [employer]  . Financial resource strain: Not on file  . Food insecurity:    Worry: Not on file    Inability: Not on file  . Transportation needs:    Medical: Not on file    Non-medical: Not on file  Tobacco Use  . Smoking status: Former Smoker    Packs/day: 0.50    Types: Cigarettes    Last attempt to quit: 05/14/2006    Years since quitting: 12.5  . Smokeless tobacco: Current User    Types: Snuff  Substance and Sexual Activity  . Alcohol use: Yes   Alcohol/week: 4.0 standard drinks    Types: 4 Cans of beer per week    Comment: 3-4 beers after golf on the weekend  . Drug use: Not on file  . Sexual activity: Not Currently  Lifestyle  . Physical activity:    Days per week: Not on file    Minutes per session: Not on file  . Stress: Not on file  Relationships  . Social connections:    Talks on phone: Not on file    Gets together: Not on file    Attends religious service: Not on file    Active member of club or organization: Not on file    Attends meetings of clubs or organizations: Not on file    Relationship status: Not on file  . Intimate partner violence:    Fear of current or ex partner: Not on file    Emotionally abused: Not on file    Physically abused: Not on file    Forced sexual activity: Not on file  Other Topics Concern  . Not on file  Social History Narrative   Pt lives alone in [place].     Review of Systems: General: negative for chills, fever, night sweats or weight changes.  Cardiovascular: negative for chest pain, dyspnea on exertion, edema, orthopnea, palpitations, paroxysmal nocturnal dyspnea or shortness of  breath Dermatological: negative for rash Respiratory: negative for cough or wheezing Urologic: negative for hematuria Abdominal: negative for nausea, vomiting, diarrhea, bright red blood per rectum, melena, or hematemesis Neurologic: negative for visual changes, syncope, or dizziness All other systems reviewed and are otherwise negative except as noted above.    Blood pressure (!) 144/96, pulse 71, height 5\' 10"  (1.778 m), weight 208 lb (94.3 kg).  General appearance: alert and no distress Neck: no adenopathy, no carotid bruit, no JVD, supple, symmetrical, trachea midline and thyroid not enlarged, symmetric, no tenderness/mass/nodules Lungs: clear to auscultation bilaterally Heart: regular rate and rhythm, S1, S2 normal, no murmur, click, rub or gallop Extremities: extremities normal, atraumatic,  no cyanosis or edema Pulses: 2+ and symmetric Skin: Skin color, texture, turgor normal. No rashes or lesions Neurologic: Alert and oriented X 3, normal strength and tone. Normal symmetric reflexes. Normal coordination and gait  EKG sinus rhythm at 71 without ST or T wave changes.  Personally reviewed this EKG.  ASSESSMENT AND PLAN:   NSTEMI (non-ST elevated myocardial infarction) ([place]) History of non-STEMI after a colonoscopy and GI bleed with cath performed 06/13/2016 revealing occluded small marginal branch which was treated medically.  His EF was normal.  Said no recurrent symptoms.  Essential hypertension History of essential hypertension her blood pressure measured today at 144/96.  His blood pressure log which he provided today showed his blood pressures have been much better controlled at home.  He is on metoprolol.  Continue current medications      Lorretta Harp MD [facility], [facility] 12/02/2018 9:51 AM

## 2019-02-04 DIAGNOSIS — L570 Actinic keratosis: Secondary | ICD-10-CM | POA: Diagnosis not present

## 2019-03-22 ENCOUNTER — Other Ambulatory Visit: Payer: Self-pay | Admitting: Cardiovascular Disease

## 2019-04-26 DIAGNOSIS — H40013 Open angle with borderline findings, low risk, bilateral: Secondary | ICD-10-CM | POA: Diagnosis not present

## 2019-08-12 DIAGNOSIS — L570 Actinic keratosis: Secondary | ICD-10-CM | POA: Diagnosis not present

## 2019-08-12 DIAGNOSIS — C44519 Basal cell carcinoma of skin of other part of trunk: Secondary | ICD-10-CM | POA: Diagnosis not present

## 2019-09-27 ENCOUNTER — Other Ambulatory Visit: Payer: Self-pay

## 2019-09-27 MED ORDER — METOPROLOL TARTRATE 25 MG PO TABS: 12.5000 mg | ORAL_TABLET | Freq: Two times a day (BID) | ORAL | 1 refills | Status: DC

## 2019-12-01 ENCOUNTER — Ambulatory Visit: Payer: BC Managed Care – PPO | Admitting: Cardiovascular Disease

## 2019-12-01 ENCOUNTER — Encounter: Payer: Self-pay | Admitting: Cardiovascular Disease

## 2019-12-01 ENCOUNTER — Other Ambulatory Visit: Payer: Self-pay

## 2019-12-01 VITALS — BP 175/101 | HR 67 | Ht 69.0 in | Wt 213.6 lb

## 2019-12-01 DIAGNOSIS — I10 Essential (primary) hypertension: Secondary | ICD-10-CM

## 2019-12-01 DIAGNOSIS — E785 Hyperlipidemia, unspecified: Secondary | ICD-10-CM

## 2019-12-01 DIAGNOSIS — E782 Mixed hyperlipidemia: Secondary | ICD-10-CM

## 2019-12-01 DIAGNOSIS — I214 Non-ST elevation (NSTEMI) myocardial infarction: Secondary | ICD-10-CM

## 2019-12-01 MED ORDER — ATORVASTATIN CALCIUM 20 MG PO TABS: 20.0000 mg | ORAL_TABLET | Freq: Every day | ORAL | 3 refills | Status: DC

## 2019-12-01 MED ORDER — LISINOPRIL 5 MG PO TABS: 5.0000 mg | ORAL_TABLET | Freq: Every day | ORAL | 3 refills | Status: DC

## 2019-12-01 NOTE — Progress Notes (Signed)
12/01/2019 Maple Mirza   09-16-1956  [id number]  Primary Physician Garlon Hatchet Jaymes Graff, MD Primary Cardiologist: Lorretta Harp MD Lupe Carney, [place]  HPI:  Clayton Jones is a 64 y.o.   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 Scientist, physiological at [employer].  I last saw him in the office 12/02/2018. He has a history of treated hypertension. He had a non-STEMI in the setting of colonoscopy, and GI bleed. Dr. Ethelda Chick his catheterization on 06/13/16 revealing an occluded small first marginal branch with normal LV function. Medical therapy was recommended. He's had no recurrent chest pain and is fairly active. He plays golf several times a week.  Since I saw him a year ago he continues to do well.  He has gained 5 to 6 pounds due to working at home and dietary indiscretion.  His blood pressures been running on the high side as well.  He continues to walk and play golf without symptoms or limitation specifically denying chest pain or shortness of breath.   Current Meds  Medication Sig  . aspirin EC 81 MG EC tablet Take 1 tablet (81 mg total) by mouth daily.  . metoprolol tartrate (LOPRESSOR) 25 MG tablet Take 0.5 tablets (12.5 mg total) by mouth 2 (two) times daily.  . Multiple Vitamins-Minerals (MULTIVITAMIN PO) Take 1 tablet by mouth daily.  . Omega-3 Fatty Acids (FISH OIL PO) Take 1 capsule by mouth daily.     No Known Allergies  Social History   Socioeconomic History  . Marital status: Single    Spouse name: Not on file  . Number of children: Not on file  . Years of education: Not on file  . Highest education level: Not on file  Occupational History  . Occupation: Haematologist for [employer]  . Smoking status: Former Smoker    Packs/day: 0.50    Types: Cigarettes    Quit date: 05/14/2006    Years since quitting: 13.5  . Smokeless tobacco: Current User    Types: Snuff  Substance and Sexual Activity  . Alcohol use: Yes    Alcohol/week: 4.0  standard drinks    Types: 4 Cans of beer per week    Comment: 3-4 beers after golf on the weekend  . Drug use: Not on file  . Sexual activity: Not Currently  Other Topics Concern  . Not on file  Social History Narrative   Pt lives alone in [place].   Social Determinants of Health   Financial Resource Strain:   . Difficulty of Paying Living Expenses: Not on file  Food Insecurity:   . Worried About Charity fundraiser in the Last Year: Not on file  . Ran Out of Food in the Last Year: Not on file  Transportation Needs:   . Lack of Transportation (Medical): Not on file  . Lack of Transportation (Non-Medical): Not on file  Physical Activity:   . Days of Exercise per Week: Not on file  . Minutes of Exercise per Session: Not on file  Stress:   . Feeling of Stress : Not on file  Social Connections:   . Frequency of Communication with Friends and Family: Not on file  . Frequency of Social Gatherings with Friends and Family: Not on file  . Attends Religious Services: Not on file  . Active Member of Clubs or Organizations: Not on file  . Attends Archivist Meetings: Not on file  . Marital Status: Not on  file  Intimate Partner Violence:   . Fear of Current or Ex-Partner: Not on file  . Emotionally Abused: Not on file  . Physically Abused: Not on file  . Sexually Abused: Not on file     Review of Systems: General: negative for chills, fever, night sweats or weight changes.  Cardiovascular: negative for chest pain, dyspnea on exertion, edema, orthopnea, palpitations, paroxysmal nocturnal dyspnea or shortness of breath Dermatological: negative for rash Respiratory: negative for cough or wheezing Urologic: negative for hematuria Abdominal: negative for nausea, vomiting, diarrhea, bright red blood per rectum, melena, or hematemesis Neurologic: negative for visual changes, syncope, or dizziness All other systems reviewed and are otherwise negative except as noted  above.    Blood pressure (!) 175/101, pulse 67, height 5\' 9"  (1.753 m), weight 213 lb 9.6 oz (96.9 kg), SpO2 96 %.  General appearance: alert and no distress Neck: no adenopathy, no carotid bruit, no JVD, supple, symmetrical, trachea midline and thyroid not enlarged, symmetric, no tenderness/mass/nodules Lungs: clear to auscultation bilaterally Heart: regular rate and rhythm, S1, S2 normal, no murmur, click, rub or gallop Extremities: extremities normal, atraumatic, no cyanosis or edema Pulses: 2+ and symmetric Skin: Skin color, texture, turgor normal. No rashes or lesions Neurologic: Alert and oriented X 3, normal strength and tone. Normal symmetric reflexes. Normal coordination and gait  EKG sinus rhythm at 67 with nonspecific ST and T wave changes.  I personally reviewed this EKG.  ASSESSMENT AND PLAN:   NSTEMI (non-ST elevated myocardial infarction) ([place]) History of CAD status post non-STEMI 06/13/2016 with cardiac catheterization performed by Dr. Saunders Revel revealing occluded small first marginal branch with normal LV function.  Medical therapy was recommended.  He has had no recurrent symptoms.  He is fairly active and walks and plays golf frequently.  Essential hypertension History of essential hypertension blood pressure measured today 175/101.  He did check his blood pressure at home this morning which was 150/90.  He admits to dietary indiscretion.  He is on low-dose metoprolol 12.5 mg twice daily.  I am going to add lisinopril 5 mg a day, will check a basic metabolic panel in 2 weeks.  I have asked him to keep a blood pressure log daily for 30 days.  He will return in 4 weeks to see Cyril Mourning our blood pressure clinic to review and make appropriate adjustments.  Hyperlipidemia History of hyperlipidemia not on statin therapy with lipid profile performed 1 year ago revealing total cholesterol 201, LDL of 109 and HDL of 51.  I am going to begin him on atorvastatin 20 mg a day and we will  recheck a lipid liver profile in 2 months.      Lorretta Harp MD FACP,FACC,FAHA, [facility] 12/01/2019 12:05 PM

## 2019-12-01 NOTE — Assessment & Plan Note (Signed)
History of CAD status post non-STEMI 06/13/2016 with cardiac catheterization performed by Dr. Saunders Revel revealing occluded small first marginal branch with normal LV function.  Medical therapy was recommended.  He has had no recurrent symptoms.  He is fairly active and walks and plays golf frequently.

## 2019-12-01 NOTE — Patient Instructions (Signed)
Medication Instructions:  Start taking 5mg  Lisinopril Daily Start taking 20mg  Atorvastatin Daily  If you need a refill on your cardiac medications before your next appointment, please call your pharmacy.   Lab work: BMET in 2 weeks Fasting Lipid and Hepatic Function in 2 months If you have labs (blood work) drawn today and your tests are completely normal, you will receive your results only by: [place] (if you have MyChart) OR A paper copy in the mail If you have any lab test that is abnormal or we need to change your treatment, we will call you to review the results.  Testing/Procedures: NONE  Follow-Up: At [facility], you and your health needs are our priority.  As part of our continuing mission to provide you with exceptional heart care, we have created designated Provider Care Teams.  These Care Teams include your primary Cardiologist (physician) and Advanced Practice Providers (APPs -  Physician Assistants and Nurse Practitioners) who all work together to provide you with the care you need, when you need it. You may see Dr. Gwenlyn Found or one of the following Advanced Practice Providers on your designated Care Team:    Kerin Ransom, PA-C  [place], [place]  Coletta Memos, [place]  Your physician wants you to follow-up in: 1 month with PharmD to discuss Blood Pressure log Your physician wants you to follow-up in: 1 year with Dr. Gwenlyn Found   Any Other Special Instructions Will Be Listed Below (If Applicable). Please keep a Blood Pressure Log for 30 days to discuss with pharmacy in 1 month.

## 2019-12-01 NOTE — Assessment & Plan Note (Signed)
History of hyperlipidemia not on statin therapy with lipid profile performed 1 year ago revealing total cholesterol 201, LDL of 109 and HDL of 51.  I am going to begin him on atorvastatin 20 mg a day and we will recheck a lipid liver profile in 2 months.

## 2019-12-01 NOTE — Assessment & Plan Note (Signed)
History of essential hypertension blood pressure measured today 175/101.  He did check his blood pressure at home this morning which was 150/90.  He admits to dietary indiscretion.  He is on low-dose metoprolol 12.5 mg twice daily.  I am going to add lisinopril 5 mg a day, will check a basic metabolic panel in 2 weeks.  I have asked him to keep a blood pressure log daily for 30 days.  He will return in 4 weeks to see Cyril Mourning our blood pressure clinic to review and make appropriate adjustments.

## 2019-12-15 DIAGNOSIS — E785 Hyperlipidemia, unspecified: Secondary | ICD-10-CM | POA: Diagnosis not present

## 2019-12-15 DIAGNOSIS — I10 Essential (primary) hypertension: Secondary | ICD-10-CM | POA: Diagnosis not present

## 2019-12-16 LAB — BASIC METABOLIC PANEL
BUN/Creatinine Ratio: 19 (ref 10–24)
BUN: 18 mg/dL (ref 8–27)
CO2: 22 mmol/L (ref 20–29)
Calcium: 9.4 mg/dL (ref 8.6–10.2)
Chloride: 103 mmol/L (ref 96–106)
Creatinine, Ser: 0.97 mg/dL (ref 0.76–1.27)
GFR calc Af Amer: 95 mL/min/{1.73_m2} (ref >59)
GFR calc non Af Amer: 82 mL/min/{1.73_m2} (ref >59)
Glucose: 84 mg/dL (ref 65–99)
Potassium: 3.7 mmol/L (ref 3.5–5.2)
Sodium: 140 mmol/L (ref 134–144)

## 2020-01-04 ENCOUNTER — Other Ambulatory Visit: Payer: Self-pay

## 2020-01-04 ENCOUNTER — Ambulatory Visit (INDEPENDENT_AMBULATORY_CARE_PROVIDER_SITE_OTHER): Payer: BC Managed Care – PPO | Admitting: Pharmacist Clinician (PhC)/ Clinical Pharmacy Specialist

## 2020-01-04 DIAGNOSIS — E785 Hyperlipidemia, unspecified: Secondary | ICD-10-CM

## 2020-01-04 DIAGNOSIS — I10 Essential (primary) hypertension: Secondary | ICD-10-CM | POA: Diagnosis not present

## 2020-01-04 MED ORDER — LISINOPRIL 10 MG PO TABS: 10.0000 mg | ORAL_TABLET | Freq: Every day | ORAL | 3 refills | Status: DC

## 2020-01-04 NOTE — Patient Instructions (Signed)
Return for a a follow up appointment in 3 months.  If your home BP readings are all in good range (around 130/80), please send a MyChart message about 2-3 weeks before your appointment.  Include a picture of 2-3 weeks of readings.  If all is good, we can cancel the appointment.    Go to the lab in mid April to check cholesterol levels and liver function.    Your blood pressure today is 156/96  Check your blood pressure at home several days each week and keep record of the readings.  Take your BP meds as follows:  Increase your lisinopril to 10 mg once daily (take 2 of the 5 mg tablets until gone)  Bring all of your meds, your BP cuff and your record of home blood pressures to your next appointment.  Exercise as you're able, try to walk approximately 30 minutes per day.  Keep salt intake to a minimum, especially watch canned and prepared boxed foods.  Eat more fresh fruits and vegetables and fewer canned items.  Avoid eating in fast food restaurants.    HOW TO TAKE YOUR BLOOD PRESSURE: . Rest 5 minutes before taking your blood pressure. .  Don't smoke or drink caffeinated beverages for at least 30 minutes before. . Take your blood pressure before (not after) you eat. . Sit comfortably with your back supported and both feet on the floor (don't cross your legs). . Elevate your arm to heart level on a table or a desk. . Use the proper sized cuff. It should fit smoothly and snugly around your bare upper arm. There should be enough room to slip a fingertip under the cuff. The bottom edge of the cuff should be 1 inch above the crease of the elbow. . Ideally, take 3 measurements at one sitting and record the average.

## 2020-01-04 NOTE — Progress Notes (Signed)
01/05/2020 Maple Mirza [birth year] [id number]   HPI:  Clayton Jones is a 64 y.o. male patient of Dr Gwenlyn Found, with a PMH below who presents today for hypertension clinic evaluation.  He was last seen by Dr. Gwenlyn Found on Feb 17 and found to have a blood pressure of 175/101.  He admitted to dietary indescretions, noting that his home pressure that morning had been 150/90.  He was started on lisinopril 5 mg daily, in addition to his metoprolol 12.5 mg bid, and asked to return today for follow up.    He has not had any problems with the addition of lisinopril, and brings in a list of daily home readings with him (see below).  He has focused the last month on sodium and trans fat reductions in his diet, eating more fruits and vegetables.  He has also switched his snacks to nuts.    Past Medical History: ASCVD NSTEMI - occluded small first marginal branch (05/2016)  hyperlipidemia 11/2018-  TC 201, TG 207, HDL 51, LDL 109  GI bleed After colonoscopy, had NSTEMI at same time     Blood Pressure Goal:  130/80  Current Medications: lisinopril 5 mg qd, metoprolol 12.5 mg bid   Family Hx: mother and grandmother with htn, lipids; mother died at 52; father was smoker, DM died at 89 from circulatory issues; siblings healthy; no children  Social Hx: no tobacco, 2-3 drinks per week, socially; 2 coffee each morning  Diet:  Working on sodium reduction, trans fats; rarely eats out; more frutis/veggies, and nuts for snacks, not chips and crackers; pork, chicken and fish;   Exercise: 9 holes after work; 18 on weekends; 2-3 times per weeks; walks course, carry own bag; has stationary bike uses occasionally  Home BP readings: home cuff several years old, has checked most mornings for past month.  Looking at the last 16 readings, had average of 138/83 with range of 119-160/74-91.     Intolerances: nkda  Labs: 12/2019: Na 140, K 3.7, Glu 84, BUN 18, SCr 0.97  Wt Readings from Last 3 Encounters:  01/04/20 213 lb  6.4 oz (96.8 kg)  12/01/19 213 lb 9.6 oz (96.9 kg)  12/02/18 208 lb (94.3 kg)   BP Readings from Last 3 Encounters:  01/04/20 (!) 156/96  12/01/19 (!) 175/101  12/02/18 (!) 144/96   Pulse Readings from Last 3 Encounters:  12/01/19 67  12/02/18 71  08/04/17 68    Current Outpatient Medications  Medication Sig Dispense Refill  . aspirin EC 81 MG EC tablet Take 1 tablet (81 mg total) by mouth daily. 30 tablet 0  . atorvastatin (LIPITOR) 20 MG tablet Take 1 tablet (20 mg total) by mouth daily. 90 tablet 3  . lisinopril (ZESTRIL) 10 MG tablet Take 1 tablet (10 mg total) by mouth daily. 90 tablet 3  . metoprolol tartrate (LOPRESSOR) 25 MG tablet Take 0.5 tablets (12.5 mg total) by mouth 2 (two) times daily. 90 tablet 1  . Multiple Vitamins-Minerals (MULTIVITAMIN PO) Take 1 tablet by mouth daily.    . Omega-3 Fatty Acids (FISH OIL PO) Take 1 capsule by mouth daily.     No current facility-administered medications for this visit.    No Known Allergies  Past Medical History:  Diagnosis Date  . CAD (coronary artery disease)    a. 05/2016: NSTEMI in setting of GIB. Cath showing total occlusion of OM1 with collaterals present, 20% mid-LAD stenosis, and 40% 1st Diag stenosis. No intervention indicated.   Marland Kitchen  HTN (hypertension)   . Skin cancer    Basal and Squamous    Blood pressure (!) 156/96, weight 213 lb 6.4 oz (96.8 kg).  Essential hypertension Patient with systolic/diastolic hypertension not to goal but improved with low dose lisinopril.  Will have him increase the dose to 10 mg daily.  He was interested in doing more lifestyle modifications rather than medication increases.  Had a long discussion about hypertension as a function of aging.  He is agreeable to the dose increase and understands that he will most likely need lifelong medication, but if his pressure drops too low with medication and lifestyle changes we can consider decreasing doses.  We will plan to see him back in 3  months, although he will send a MyChart note a week or two prior with him current readings.  Agreed that if they are within range, we can cancel that appointmentm   Tommy Medal PharmD CPP [facility] [address] [place], [place] [address] [phone]

## 2020-01-05 ENCOUNTER — Encounter: Payer: Self-pay | Admitting: Pharmacist Clinician (PhC)/ Clinical Pharmacy Specialist

## 2020-01-05 NOTE — Assessment & Plan Note (Signed)
Patient with systolic/diastolic hypertension not to goal but improved with low dose lisinopril.  Will have him increase the dose to 10 mg daily.  He was interested in doing more lifestyle modifications rather than medication increases.  Had a long discussion about hypertension as a function of aging.  He is agreeable to the dose increase and understands that he will most likely need lifelong medication, but if his pressure drops too low with medication and lifestyle changes we can consider decreasing doses.  We will plan to see him back in 3 months, although he will send a MyChart note a week or two prior with him current readings.  Agreed that if they are within range, we can cancel that appointmentm

## 2020-01-25 DIAGNOSIS — L570 Actinic keratosis: Secondary | ICD-10-CM | POA: Diagnosis not present

## 2020-01-26 DIAGNOSIS — E785 Hyperlipidemia, unspecified: Secondary | ICD-10-CM | POA: Diagnosis not present

## 2020-01-27 LAB — HEPATIC FUNCTION PANEL
ALT: 30 IU/L (ref 0–44)
AST: 24 IU/L (ref 0–40)
Albumin: 4.9 g/dL — ABNORMAL HIGH (ref 3.8–4.8)
Alkaline Phosphatase: 129 IU/L — ABNORMAL HIGH (ref 39–117)
Bilirubin Total: 0.8 mg/dL (ref 0.0–1.2)
Bilirubin, Direct: 0.2 mg/dL (ref 0.00–0.40)
Total Protein: 7 g/dL (ref 6.0–8.5)

## 2020-01-27 LAB — LIPID PANEL
Chol/HDL Ratio: 2.5 ratio (ref 0.0–5.0)
Cholesterol, Total: 119 mg/dL (ref 100–199)
HDL: 48 mg/dL (ref >39)
LDL Chol Calc (NIH): 43 mg/dL (ref 0–99)
Triglycerides: 172 mg/dL — ABNORMAL HIGH (ref 0–149)
VLDL Cholesterol Cal: 28 mg/dL (ref 5–40)

## 2020-02-07 DIAGNOSIS — R748 Abnormal levels of other serum enzymes: Secondary | ICD-10-CM | POA: Diagnosis not present

## 2020-03-15 ENCOUNTER — Other Ambulatory Visit: Payer: Self-pay | Admitting: Cardiovascular Disease

## 2020-03-28 DIAGNOSIS — H353131 Nonexudative age-related macular degeneration, bilateral, early dry stage: Secondary | ICD-10-CM | POA: Diagnosis not present

## 2020-04-06 ENCOUNTER — Ambulatory Visit: Payer: BC Managed Care – PPO

## 2020-07-25 DIAGNOSIS — L570 Actinic keratosis: Secondary | ICD-10-CM | POA: Diagnosis not present

## 2020-11-17 ENCOUNTER — Other Ambulatory Visit: Payer: Self-pay | Admitting: Cardiovascular Disease

## 2021-01-16 ENCOUNTER — Other Ambulatory Visit: Payer: Self-pay

## 2021-01-16 ENCOUNTER — Ambulatory Visit (INDEPENDENT_AMBULATORY_CARE_PROVIDER_SITE_OTHER): Payer: Medicare HMO | Admitting: Cardiovascular Disease

## 2021-01-16 ENCOUNTER — Encounter: Payer: Self-pay | Admitting: Cardiovascular Disease

## 2021-01-16 VITALS — BP 142/88 | Ht 70.0 in | Wt 204.6 lb

## 2021-01-16 DIAGNOSIS — E785 Hyperlipidemia, unspecified: Secondary | ICD-10-CM | POA: Diagnosis not present

## 2021-01-16 DIAGNOSIS — I214 Non-ST elevation (NSTEMI) myocardial infarction: Secondary | ICD-10-CM

## 2021-01-16 DIAGNOSIS — I10 Essential (primary) hypertension: Secondary | ICD-10-CM

## 2021-01-16 NOTE — Assessment & Plan Note (Signed)
History of hyperlipidemia on statin therapy with lipid profile performed 01/26/2020 revealing total cholesterol 119, LDL 43 and HDL 48.

## 2021-01-16 NOTE — Patient Instructions (Addendum)
Medication Instructions:  Your physician recommends that you continue on your current medications as directed. Please refer to the Current Medication list given to you today.  *If you need a refill on your cardiac medications before your next appointment, please call your pharmacy*  Lab Work: Your physician recommends that you return for lab work in: 1-2 weeks fasting lipid/liver profile.  If you have labs (blood work) drawn today and your tests are completely normal, you will receive your results only by: Marland Kitchen MyChart Message (if you have MyChart) OR . A paper copy in the mail If you have any lab test that is abnormal or we need to change your treatment, we will call you to review the results.   Follow-Up: At [facility], you and your health needs are our priority.  As part of our continuing mission to provide you with exceptional heart care, we have created designated Provider Care Teams.  These Care Teams include your primary Cardiologist (physician) and Advanced Practice Providers (APPs -  Physician Assistants and Nurse Practitioners) who all work together to provide you with the care you need, when you need it.  We recommend signing up for the patient portal called "MyChart".  Sign up information is provided on this After Visit Summary.  MyChart is used to connect with patients for Virtual Visits (Telemedicine).  Patients are able to view lab/test results, encounter notes, upcoming appointments, etc.  Non-urgent messages can be sent to your provider as well.   To learn more about what you can do with MyChart, go to [email or web address].    Your next appointment:   12 month(s)  The format for your next appointment:   In Person  Provider:   Quay Burow, MD

## 2021-01-16 NOTE — Assessment & Plan Note (Signed)
History of essential hypertension blood pressure measured today 150/98.  Usually is much lower than this.  He is on lisinopril and metoprolol.

## 2021-01-16 NOTE — Assessment & Plan Note (Signed)
History of non-STEMI 06/13/2016 cardiac cath performed by Dr. Saunders Revel revealing occluded small marginal branch which was treated medically.  He has had no recurrent symptoms.

## 2021-01-16 NOTE — Progress Notes (Signed)
01/16/2021 Maple Mirza   06/08/1956  [id number]  Primary Physician Garlon Hatchet Jaymes Graff, MD Primary Cardiologist: Lorretta Harp MD FACP, [place], [place], [place]  HPI:  Clayton Jones is a 65 y.o.  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 He has a history of treated hypertension. He had a non-STEMI in the setting of colonoscopy, and GI bleed. Dr. Ethelda Chick his catheterization on 06/13/16 revealing an occluded small first marginal branch with normal LV function. Medical therapy was recommended. He's had no recurrent chest pain and is fairly active. He plays golf several times a week.  Since I saw him a year ago he continues to do well.    He retired at the end of last year and he says his stress level is the lowest has ever been.  He plays golf at least 5 days a week.  He denies chest pain or shortness of breath..    Current Meds  Medication Sig  . aspirin EC 81 MG EC tablet Take 1 tablet (81 mg total) by mouth daily.  Marland Kitchen atorvastatin (LIPITOR) 20 MG tablet Take 1 tablet by mouth once daily  . lisinopril (ZESTRIL) 10 MG tablet Take 1 tablet (10 mg total) by mouth daily.  . metoprolol tartrate (LOPRESSOR) 25 MG tablet TAKE 1/2 TABLET BY MOUTH TWICE DAILY  . Multiple Vitamins-Minerals (MULTIVITAMIN PO) Take 1 tablet by mouth daily.  . Omega-3 Fatty Acids (FISH OIL PO) Take 1 capsule by mouth daily.     No Known Allergies  Social History   Socioeconomic History  . Marital status: Single    Spouse name: Not on file  . Number of children: Not on file  . Years of education: Not on file  . Highest education level: Not on file  Occupational History  . Occupation: Haematologist for [employer]  . Smoking status: Former Smoker    Packs/day: 0.50    Types: Cigarettes    Quit date: 05/14/2006    Years since quitting: 14.6  . Smokeless tobacco: Current User    Types: Snuff  Substance and Sexual  Activity  . Alcohol use: Yes    Alcohol/week: 4.0 standard drinks    Types: 4 Cans of beer per week    Comment: 3-4 beers after golf on the weekend  . Drug use: Not on file  . Sexual activity: Not Currently  Other Topics Concern  . Not on file  Social History Narrative   Pt lives alone in [place].   Social Determinants of Health   Financial Resource Strain: Not on file  Food Insecurity: Not on file  Transportation Needs: Not on file  Physical Activity: Not on file  Stress: Not on file  Social Connections: Not on file  Intimate Partner Violence: Not on file     Review of Systems: General: negative for chills, fever, night sweats or weight changes.  Cardiovascular: negative for chest pain, dyspnea on exertion, edema, orthopnea, palpitations, paroxysmal nocturnal dyspnea or shortness of breath Dermatological: negative for rash Respiratory: negative for cough or wheezing Urologic: negative for hematuria Abdominal: negative for nausea, vomiting, diarrhea, bright red blood per rectum, melena, or hematemesis Neurologic: negative for visual changes, syncope, or dizziness All other systems reviewed and are otherwise negative except as noted above.    Blood pressure (!) 150/98, height 5\' 10"  (1.778 m), weight 204 lb 9.6 oz (92.8 kg), SpO2 97 %.  General appearance: alert  and no distress Neck: no adenopathy, no carotid bruit, no JVD, supple, symmetrical, trachea midline and thyroid not enlarged, symmetric, no tenderness/mass/nodules Lungs: clear to auscultation bilaterally Heart: regular rate and rhythm, S1, S2 normal, no murmur, click, rub or gallop Extremities: extremities normal, atraumatic, no cyanosis or edema Pulses: 2+ and symmetric Skin: Skin color, texture, turgor normal. No rashes or lesions Neurologic: Alert and oriented X 3, normal strength and tone. Normal symmetric reflexes. Normal coordination and gait  EKG sinus rhythm at 63 without ST or T wave changes.  I  personally reviewed this EKG.  ASSESSMENT AND PLAN:   NSTEMI (non-ST elevated myocardial infarction) ([place]) History of non-STEMI 06/13/2016 cardiac cath performed by Dr. Saunders Revel revealing occluded small marginal branch which was treated medically.  He has had no recurrent symptoms.  Essential hypertension History of essential hypertension blood pressure measured today 150/98.  Usually is much lower than this.  He is on lisinopril and metoprolol.  Hyperlipidemia History of hyperlipidemia on statin therapy with lipid profile performed 01/26/2020 revealing total cholesterol 119, LDL 43 and HDL 48.      Lorretta Harp MD FACP,FACC,FAHA, [facility] 01/16/2021 10:32 AM

## 2021-01-18 ENCOUNTER — Other Ambulatory Visit: Payer: Self-pay | Admitting: Cardiovascular Disease

## 2021-01-24 LAB — HEPATIC FUNCTION PANEL
ALT: 29 IU/L (ref 0–44)
AST: 25 IU/L (ref 0–40)
Albumin: 4.4 g/dL (ref 3.8–4.8)
Alkaline Phosphatase: 114 IU/L (ref 44–121)
Bilirubin Total: 0.8 mg/dL (ref 0.0–1.2)
Bilirubin, Direct: 0.2 mg/dL (ref 0.00–0.40)
Total Protein: 6.6 g/dL (ref 6.0–8.5)

## 2021-01-24 LAB — LIPID PANEL
Chol/HDL Ratio: 2.4 ratio (ref 0.0–5.0)
Cholesterol, Total: 125 mg/dL (ref 100–199)
HDL: 52 mg/dL (ref >39)
LDL Chol Calc (NIH): 57 mg/dL (ref 0–99)
Triglycerides: 78 mg/dL (ref 0–149)
VLDL Cholesterol Cal: 16 mg/dL (ref 5–40)

## 2021-02-13 ENCOUNTER — Other Ambulatory Visit: Payer: Self-pay | Admitting: Cardiovascular Disease

## 2021-03-09 ENCOUNTER — Other Ambulatory Visit: Payer: Self-pay

## 2021-03-09 MED ORDER — ATORVASTATIN CALCIUM 20 MG PO TABS: 1.0000 | ORAL_TABLET | Freq: Every day | ORAL | 3 refills | Status: DC

## 2021-03-17 ENCOUNTER — Other Ambulatory Visit: Payer: Self-pay | Admitting: Cardiovascular Disease

## 2022-01-11 ENCOUNTER — Other Ambulatory Visit: Payer: Self-pay | Admitting: Cardiovascular Disease

## 2022-02-12 ENCOUNTER — Ambulatory Visit: Payer: Medicare HMO | Admitting: Cardiovascular Disease

## 2022-02-13 ENCOUNTER — Encounter: Payer: Self-pay | Admitting: Cardiovascular Disease

## 2022-02-13 ENCOUNTER — Ambulatory Visit: Payer: Medicare HMO | Admitting: Cardiovascular Disease

## 2022-02-13 VITALS — BP 140/94 | HR 56 | Ht 68.0 in | Wt 205.0 lb

## 2022-02-13 DIAGNOSIS — I214 Non-ST elevation (NSTEMI) myocardial infarction: Secondary | ICD-10-CM

## 2022-02-13 DIAGNOSIS — I10 Essential (primary) hypertension: Secondary | ICD-10-CM

## 2022-02-13 DIAGNOSIS — E782 Mixed hyperlipidemia: Secondary | ICD-10-CM | POA: Diagnosis not present

## 2022-02-13 NOTE — Progress Notes (Signed)
? ? ? ?02/13/2022 ?Maple Mirza   ?[birth year]  ?[id number] ? ?Primary Physician Algis Greenhouse, MD ?Primary Cardiologist: Lorretta Harp MD [place], [place] ? ?HPI:  Wetzel Meester is a 66 y.o.   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 Scientist, physiological at [employer].  I last saw him in the office 01/16/2021. He has a history of treated hypertension. He had a non-STEMI in the setting of colonoscopy, and GI bleed. [name] performed his catheterization on 06/13/16 revealing an occluded small first marginal branch with normal LV function. Medical therapy was recommended. He's had no recurrent chest pain and is fairly active. He plays golf several times a week. ?  ?Since I saw him a year ago he continues to do well.    He retired a little over a year ago and says he has had the lowest stress that he had in ages.  He now plays golf 5 days a week.  He denies chest pain or shortness of breath. ? ? ?Current Meds  ?Medication Sig  ? aspirin EC 81 MG EC tablet Take 1 tablet (81 mg total) by mouth daily.  ? atorvastatin (LIPITOR) 20 MG tablet Take 1 tablet (20 mg total) by mouth daily.  ? lisinopril (ZESTRIL) 10 MG tablet Take 1 tablet (10 mg total) by mouth daily. Keep the appointment with Dr. Gwenlyn Found for future refills.  ? metoprolol tartrate (LOPRESSOR) 25 MG tablet Take 1/2 (one-half) tablet by mouth twice daily  ? Multiple Vitamins-Minerals (MULTIVITAMIN PO) Take 1 tablet by mouth daily.  ? Omega-3 Fatty Acids (FISH OIL PO) Take 1 capsule by mouth daily.  ?  ? ?No Known Allergies ? ?Social History  ? ?Socioeconomic History  ? Marital status: Single  ?  Spouse name: Not on file  ? Number of children: Not on file  ? Years of education: Not on file  ? Highest education level: Not on file  ?Occupational History  ? 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 Smoking status: Former  ?  Packs/day: 0.50  ?  Types: Cigarettes  ?  Quit date: 05/14/2006  ?  Years since quitting: 15.7  ? Smokeless tobacco:  Current  ?  Types: Snuff  ?Substance and Sexual Activity  ? Alcohol use: Yes  ?  Alcohol/week: 4.0 standard drinks  ?  Types: 4 Cans of beer per week  ?  Comment: 3-4 beers after golf on the weekend  ? Drug use: Not on file  ? Sexual activity: Not Currently  ?Other Topics Concern  ? Not on file  ?Social History Narrative  ? Pt lives alone in [place].  ? ?Social Determinants of Health  ? ?Financial Resource Strain: Not on file  ?Food Insecurity: Not on file  ?Transportation Needs: Not on file  ?Physical Activity: Not on file  ?Stress: Not on file  ?Social Connections: Not on file  ?Intimate Partner Violence: Not on file  ?  ? ?Review of Systems: ?General: negative for chills, fever, night sweats or weight changes.  ?Cardiovascular: negative for chest pain, dyspnea on exertion, edema, orthopnea, palpitations, paroxysmal nocturnal dyspnea or shortness of breath ?Dermatological: negative for rash ?Respiratory: negative for cough or wheezing ?Urologic: negative for hematuria ?Abdominal: negative for nausea, vomiting, diarrhea, bright red blood per rectum, melena, or hematemesis ?Neurologic: negative for visual changes, syncope, or dizziness ?All other systems reviewed and are otherwise negative except as noted above. ? ? ? ?Blood pressure (!) 140/94, pulse (!) 56,  height '5\' 8"'$  (1.727 m), weight 205 lb (93 kg).  ?General appearance: alert and no distress ?Neck: no adenopathy, no carotid bruit, no JVD, supple, symmetrical, trachea midline, and thyroid not enlarged, symmetric, no tenderness/mass/nodules ?Lungs: clear to auscultation bilaterally ?Heart: regular rate and rhythm, S1, S2 normal, no murmur, click, rub or gallop ?Extremities: extremities normal, atraumatic, no cyanosis or edema ?Pulses: 2+ and symmetric ?Skin: Skin color, texture, turgor normal. No rashes or lesions ?Neurologic: Grossly normal ? ?EKG sinus bradycardia 56 without ST or T wave changes.  I personally reviewed this EKG. ? ?ASSESSMENT AND PLAN:   ? ?NSTEMI (non-ST elevated myocardial infarction) ([place]) ?Non-STEMI at the time of colonoscopy with subsequent cardiac catheterization performed by Dr. Saunders Revel 06/13/2016 revealing occluded small first marginal branch which was treated medically.  He said no recurrent symptoms. ? ?Essential hypertension ?History of essential hypertension a blood pressure measured today at 140/94.  He is on lisinopril and metoprolol.  He says his blood pressure at home is usually better than this. ? ?Hyperlipidemia ?History of hyperlipidemia on statin therapy with lipid profile performed 01/23/2021 revealing total cholesterol 125, LDL 57 and HDL of 52. ? ? ? ? ?Lorretta Harp MD FACP,FACC,FAHA, FSCAI ?02/13/2022 ?11:25 AM ?

## 2022-02-13 NOTE — Assessment & Plan Note (Signed)
History of hyperlipidemia on statin therapy with lipid profile performed 01/23/2021 revealing total cholesterol 125, LDL 57 and HDL of 52. ?

## 2022-02-13 NOTE — Assessment & Plan Note (Signed)
Non-STEMI at the time of colonoscopy with subsequent cardiac catheterization performed by Dr. Saunders Revel 06/13/2016 revealing occluded small first marginal branch which was treated medically.  He said no recurrent symptoms. ?

## 2022-02-13 NOTE — Assessment & Plan Note (Signed)
History of essential hypertension a blood pressure measured today at 140/94.  He is on lisinopril and metoprolol.  He says his blood pressure at home is usually better than this. ?

## 2022-02-13 NOTE — Patient Instructions (Signed)
?  Follow-Up: ?At [facility], you and your health needs are our priority.  As part of our continuing mission to provide you with exceptional heart care, we have created designated Provider Care Teams.  These Care Teams include your primary Cardiologist (physician) and Advanced Practice Providers (APPs -  Physician Assistants and Nurse Practitioners) who all work together to provide you with the care you need, when you need it. ? ?We recommend signing up for the patient portal called "MyChart".  Sign up information is provided on this After Visit Summary.  MyChart is used to connect with patients for Virtual Visits (Telemedicine).  Patients are able to view lab/test results, encounter notes, upcoming appointments, etc.  Non-urgent messages can be sent to your provider as well.   ?To learn more about what you can do with MyChart, go to [email or web address].   ? ?Your next appointment:   ?12 month(s) ? ?The format for your next appointment:   ?In Person ? ?Provider:   ?Quay Burow MD ? ? ? ?Important Information About Sugar ? ? ? ? ?  ?

## 2022-03-11 ENCOUNTER — Other Ambulatory Visit: Payer: Self-pay | Admitting: Cardiovascular Disease

## 2022-03-14 ENCOUNTER — Other Ambulatory Visit: Payer: Self-pay

## 2022-03-14 MED ORDER — METOPROLOL TARTRATE 25 MG PO TABS: ORAL_TABLET | ORAL | 3 refills | Status: DC

## 2022-04-03 ENCOUNTER — Other Ambulatory Visit: Payer: Self-pay | Admitting: Cardiovascular Disease

## 2022-05-20 ENCOUNTER — Other Ambulatory Visit: Payer: Self-pay | Admitting: Cardiovascular Disease

## 2022-05-24 ENCOUNTER — Encounter: Payer: Self-pay | Admitting: Cardiovascular Disease

## 2023-04-01 ENCOUNTER — Other Ambulatory Visit: Payer: Self-pay | Admitting: Cardiovascular Disease

## 2023-06-09 ENCOUNTER — Other Ambulatory Visit: Payer: Self-pay | Admitting: Cardiovascular Disease

## 2023-06-26 ENCOUNTER — Other Ambulatory Visit: Payer: Self-pay | Admitting: Cardiovascular Disease

## 2023-07-01 ENCOUNTER — Other Ambulatory Visit: Payer: Self-pay | Admitting: Cardiovascular Disease

## 2023-07-01 ENCOUNTER — Encounter: Payer: Self-pay | Admitting: Cardiovascular Disease

## 2023-07-01 ENCOUNTER — Ambulatory Visit: Payer: Medicare HMO | Attending: Cardiovascular Disease | Admitting: Cardiovascular Disease

## 2023-07-01 VITALS — BP 136/84 | HR 70 | Ht 69.0 in | Wt 197.0 lb

## 2023-07-01 DIAGNOSIS — I214 Non-ST elevation (NSTEMI) myocardial infarction: Secondary | ICD-10-CM

## 2023-07-01 DIAGNOSIS — R072 Precordial pain: Secondary | ICD-10-CM

## 2023-07-01 DIAGNOSIS — I10 Essential (primary) hypertension: Secondary | ICD-10-CM

## 2023-07-01 DIAGNOSIS — E782 Mixed hyperlipidemia: Secondary | ICD-10-CM

## 2023-07-01 NOTE — Assessment & Plan Note (Signed)
History of hyperlipidemia on statin therapy with lipid profile performed 01/23/21 revealing a total cholesterol 125, LDL 57 and HDL 53.

## 2023-07-01 NOTE — Assessment & Plan Note (Signed)
History of essential hypertension a blood pressure measured today at 136/84.  He is on metoprolol and lisinopril

## 2023-07-01 NOTE — Progress Notes (Signed)
07/01/2023 Clayton Jones   1956-03-06  [id number]  Primary Physician Sol Passer Doris Cheadle, MD Primary Cardiologist: Runell Gess MD Nicholes Calamity, [place]  HPI:  Clayton Jones is a 67 y.o.  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 Production designer, theatre/television/film at [employer].  I last saw him in the office 02/13/2022. He has a history of treated hypertension. He had a non-STEMI in the setting of colonoscopy, and GI bleed. Dr. Thayer Ohm End performed his catheterization on 06/13/16 revealing an occluded small first marginal branch with normal LV function. Medical therapy was recommended. He's had no recurrent chest pain and is fairly active. He plays golf several times a week.   Since I saw him a year ago he continues to do well.    He retired and says he has had the lowest stress that he had in ages.  He now plays golf 5 days a week, walks for them..  He denies chest pain or shortness of breath.  [place] apparently [place].   Current Meds  Medication Sig   aspirin EC 81 MG EC tablet Take 1 tablet (81 mg total) by mouth daily.   atorvastatin (LIPITOR) 20 MG tablet Take 1 tablet by mouth once daily   lisinopril (ZESTRIL) 10 MG tablet Take 1 tablet by mouth once daily   metoprolol tartrate (LOPRESSOR) 25 MG tablet Take 1/2 (one-half) tablet by mouth twice daily   Multiple Vitamins-Minerals (MULTIVITAMIN PO) Take 1 tablet by mouth daily.     No Known Allergies  Social History   Socioeconomic History   Marital status: Single    Spouse name: Not on file   Number of children: Not on file   Years of education: Not on file   Highest education level: Not on file  Occupational History   Occupation: Therapist, sports for [employer]  Tobacco Use   Smoking status: Former    Current packs/day: 0.00    Types: Cigarettes    Quit date: 05/14/2006    Years since quitting: 17.1   Smokeless tobacco: Current    Types: Snuff  Substance and Sexual Activity   Alcohol  use: Yes    Alcohol/week: 4.0 standard drinks of alcohol    Types: 4 Cans of beer per week    Comment: 3-4 beers after golf on the weekend   Drug use: Not on file   Sexual activity: Not Currently  Other Topics Concern   Not on file  Social History Narrative   Pt lives alone in [place].   Social Determinants of Health   Financial Resource Strain: Low Risk  (12/21/2020)   [facility] prior to 12/14/2022., [facility] visits prior to 12/14/2022.   Overall Financial Resource Strain (CARDIA)    Difficulty of Paying Living Expenses: Not very hard  Food Insecurity: Low Risk  (05/06/2023)   [facility]   Hunger Vital Sign    Worried About Running Out of Food in the Last Year: Never true    Ran Out of Food in the Last Year: Never true  Transportation Needs: Not on file (05/06/2023)  Physical Activity: Sufficiently Active (12/21/2020)   Received from [facility] visits prior to 12/14/2022., [facility] visits prior to 12/14/2022.   Exercise Vital Sign    Days of Exercise per Week: 4 days    Minutes of  Exercise per Session: 140 min  Stress: No Stress Concern Present (12/21/2020)   [facility] from [facility] visits prior to 12/14/2022., [facility] visits prior to 12/14/2022.   [employer] of Occupational Health - Occupational Stress Questionnaire    Feeling of Stress : Only a little  Social Connections: Unknown (12/21/2020)   Received from [facility] visits prior to 12/14/2022., [facility] visits prior to 12/14/2022.   Social Advertising account executive [NHANES]    Frequency of Communication with Friends and Family: More than three times a week    Frequency of Social Gatherings with Friends and Family: More than three times a week    Attends Religious Services: Patient refused    Active  Member of Clubs or Organizations: Yes    Attends Banker Meetings: Patient refused    Marital Status: Patient refused  Intimate Partner Violence: Unknown (12/21/2020)   [facility] prior to 12/14/2022., [facility] visits prior to 12/14/2022.   Humiliation, Afraid, Rape, and Kick questionnaire    Fear of Current or Ex-Partner: Patient refused    Emotionally Abused: Patient refused    Physically Abused: Patient refused    Sexually Abused: Patient refused     Review of Systems: General: negative for chills, fever, night sweats or weight changes.  Cardiovascular: negative for chest pain, dyspnea on exertion, edema, orthopnea, palpitations, paroxysmal nocturnal dyspnea or shortness of breath Dermatological: negative for rash Respiratory: negative for cough or wheezing Urologic: negative for hematuria Abdominal: negative for nausea, vomiting, diarrhea, bright red blood per rectum, melena, or hematemesis Neurologic: negative for visual changes, syncope, or dizziness All other systems reviewed and are otherwise negative except as noted above.    Blood pressure 136/84, pulse 70, height 5\' 9"  (1.753 m), weight 197 lb (89.4 kg).  General appearance: alert and no distress Neck: no adenopathy, no carotid bruit, no JVD, supple, symmetrical, trachea midline, and thyroid not enlarged, symmetric, no tenderness/mass/nodules Lungs: clear to auscultation bilaterally Heart: regular rate and rhythm, S1, S2 normal, no murmur, click, rub or gallop Extremities: extremities normal, atraumatic, no cyanosis or edema Pulses: 2+ and symmetric Skin: Skin color, texture, turgor normal. No rashes or lesions Neurologic: Grossly normal  EKG EKG Interpretation Date/Time:  Tuesday July 01 2023 10:22:10 EDT Ventricular Rate:  70 PR Interval:  190 QRS Duration:  90 QT Interval:  384 QTC Calculation: 414 R Axis:   10  Text  Interpretation: Normal sinus rhythm Possible Left atrial enlargement Nonspecific T wave abnormality When compared with ECG of 13-Jun-2016 06:03, No significant change was found Confirmed by Nanetta Batty [phone]) on 07/01/2023 10:44:02 AM    ASSESSMENT AND PLAN:   NSTEMI (non-ST elevated myocardial infarction) (HCC) History of non-STEMI in the setting of a colonoscopy and GI bleed with catheterization performed by Dr. Okey Dupre 06/13/2016 revealing a small occluded first obtuse marginal branch with normal LV function.  He has had no recurrent symptoms.  Essential hypertension History of essential hypertension a blood pressure measured today at 136/84.  He is on metoprolol and lisinopril  Hyperlipidemia History of hyperlipidemia on statin therapy with lipid profile performed 01/23/21 revealing a total cholesterol 125, LDL 57 and HDL 53.     Runell Gess MD [facility], [facility] 07/01/2023 10:50 AM

## 2023-07-01 NOTE — Assessment & Plan Note (Signed)
History of non-STEMI in the setting of a colonoscopy and GI bleed with catheterization performed by Dr. Okey Dupre 06/13/2016 revealing a small occluded first obtuse marginal branch with normal LV function.  He has had no recurrent symptoms.

## 2023-07-01 NOTE — Patient Instructions (Signed)
Medication Instructions:  Your physician recommends that you continue on your current medications as directed. Please refer to the Current Medication list given to you today.  *If you need a refill on your cardiac medications before your next appointment, please call your pharmacy*   Follow-Up: At [facility], you and your health needs are our priority.  As part of our continuing mission to provide you with exceptional heart care, we have created designated Provider Care Teams.  These Care Teams include your primary Cardiologist (physician) and Advanced Practice Providers (APPs -  Physician Assistants and Nurse Practitioners) who all work together to provide you with the care you need, when you need it.  We recommend signing up for the patient portal called "MyChart".  Sign up information is provided on this After Visit Summary.  MyChart is used to connect with patients for Virtual Visits (Telemedicine).  Patients are able to view lab/test results, encounter notes, upcoming appointments, etc.  Non-urgent messages can be sent to your provider as well.   To learn more about what you can do with MyChart, go to [email or web address].    Your next appointment:   We will see you on an as needed basis.  Provider:   Jonathan Berry, MD  

## 2023-09-03 ENCOUNTER — Other Ambulatory Visit: Payer: Self-pay | Admitting: Cardiovascular Disease

## 2023-10-01 ENCOUNTER — Other Ambulatory Visit: Payer: Self-pay | Admitting: Cardiovascular Disease

## 2023-11-03 ENCOUNTER — Encounter: Payer: Self-pay | Admitting: Cardiovascular Disease

## 2023-12-23 ENCOUNTER — Encounter: Payer: Self-pay | Admitting: Cardiovascular Disease

## 2023-12-25 NOTE — Telephone Encounter (Signed)
 Called to patient to advise of process to get records released, sent patient access form via email 3/13/25fbg

## 2024-05-26 ENCOUNTER — Other Ambulatory Visit: Payer: Self-pay | Admitting: Cardiovascular Disease

## 2024-08-23 ENCOUNTER — Other Ambulatory Visit: Payer: Self-pay

## 2024-08-25 MED ORDER — METOPROLOL TARTRATE 25 MG PO TABS: 12.5000 mg | ORAL_TABLET | Freq: Two times a day (BID) | ORAL | 0 refills | Status: AC
